# Patient Record
Sex: Female | Born: 1985
Health system: Southern US, Community
[De-identification: ages and names within clinical notes are randomized; demographics above are authoritative.]

## PROBLEM LIST (undated history)

## (undated) DIAGNOSIS — E119 Type 2 diabetes mellitus without complications: Secondary | ICD-10-CM

## (undated) HISTORY — PX: BREAST SURGERY: SHX581

---

## 2014-08-01 ENCOUNTER — Emergency Department (HOSPITAL_COMMUNITY)
Admission: EM | Admit: 2014-08-01 | Discharge: 2014-08-01 | Disposition: A | Discharge status: Home / Self Care | Payer: Medicaid Other | Attending: Emergency Medicine | Admitting: Emergency Medicine

## 2014-08-01 ENCOUNTER — Encounter (HOSPITAL_COMMUNITY): Payer: Self-pay | Admitting: *Deleted

## 2014-08-01 DIAGNOSIS — K088 Other specified disorders of teeth and supporting structures: Secondary | ICD-10-CM | POA: Diagnosis not present

## 2014-08-01 DIAGNOSIS — T402X5A Adverse effect of other opioids, initial encounter: Secondary | ICD-10-CM | POA: Insufficient documentation

## 2014-08-01 DIAGNOSIS — T7840XA Allergy, unspecified, initial encounter: Secondary | ICD-10-CM | POA: Diagnosis not present

## 2014-08-01 DIAGNOSIS — Z91040 Latex allergy status: Secondary | ICD-10-CM | POA: Diagnosis not present

## 2014-08-01 DIAGNOSIS — E119 Type 2 diabetes mellitus without complications: Secondary | ICD-10-CM | POA: Diagnosis not present

## 2014-08-01 HISTORY — DX: Type 2 diabetes mellitus without complications: E11.9

## 2014-08-01 LAB — CBG MONITORING, ED: GLUCOSE-CAPILLARY: 83 mg/dL (ref 65–99)

## 2014-08-01 MED ORDER — EPINEPHRINE 0.3 MG/0.3ML IJ SOAJ: 0.3000 mg | Freq: Once | INTRAMUSCULAR | Status: DC

## 2014-08-01 NOTE — ED Notes (Signed)
Pt states that she began having throat tightness and tingling to her tongue this morning. Pt states that she used her epipen. Pt was recently started on antibiotics for a tooth abscess. Pt states that she is unsure if it is due to that or something that she ate.

## 2014-08-01 NOTE — Discharge Instructions (Signed)
Drug Allergy Stop Norco. Take ibuprofen for pain as directed. If your symptoms recur, use the EpiPen and return immediately. Contact your oral surgeon today to schedule the next available appointment for your dental abscess Allergic reactions to medicines are common. Some allergic reactions are mild. A delayed type of drug allergy that occurs 1 week or more after exposure to a medicine or vaccine is called serum sickness. A life-threatening, sudden (acute) allergic reaction that involves the whole body is called anaphylaxis. CAUSES  "True" drug allergies occur when there is an allergic reaction to a medicine. This is caused by overactivity of the immune system. First, the body becomes sensitized. The immune system is triggered by your first exposure to the medicine. Following this first exposure, future exposure to the same medicine may be life-threatening. Almost any medicine can cause an allergic reaction. Common ones are:  Penicillin.  Sulfonamides (sulfa drugs).  Local anesthetics.  X-ray dyes that contain iodine. SYMPTOMS  Common symptoms of a minor allergic reaction are:  Swelling around the mouth.  An itchy red rash or hives.  Vomiting or diarrhea. Anaphylaxis can cause swelling of the mouth and throat. This makes it difficult to breathe and swallow. Severe reactions can be fatal within seconds, even after exposure to only a trace amount of the drug that causes the reaction. HOME CARE INSTRUCTIONS   If you are unsure of what caused your reaction, keep a diary of foods and medicines used. Include the symptoms that followed. Avoid anything that causes reactions.  You may want to follow up with an allergy specialist after the reaction has cleared in order to be tested to confirm the allergy. It is important to confirm that your reaction is an allergy, not just a side effect to the medicine. If you have a true allergy to a medicine, this may prevent that medicine and related medicines  from being given to you when you are very ill.  If you have hives or a rash:  Take medicines as directed by your caregiver.  You may use an over-the-counter antihistamine (diphenhydramine) as needed.  Apply cold compresses to the skin or take baths in cool water. Avoid hot baths or showers.  If you are severely allergic:  Continuous observation after a severe reaction may be needed. Hospitalization is often required.  Wear a medical alert bracelet or necklace stating your allergy.  You and your family must learn how to use an anaphylaxis kit or give an epinephrine injection to temporarily treat an emergency allergic reaction. If you have had a severe reaction, always carry your epinephrine injection or anaphylaxis kit with you. This can be lifesaving if you have a severe reaction.  Do not drive or perform tasks after treatment until the medicines used to treat your reaction have worn off, or until your caregiver says it is okay. SEEK MEDICAL CARE IF:   You think you had an allergic reaction. Symptoms usually start within 30 minutes after exposure.  Symptoms are getting worse rather than better.  You develop new symptoms.  The symptoms that brought you to your caregiver return. SEEK IMMEDIATE MEDICAL CARE IF:   You have swelling of the mouth, difficulty breathing, or wheezing.  You have a tight feeling in your chest or throat.  You develop hives, swelling, or itching all over your body.  You develop severe vomiting or diarrhea.  You feel faint or pass out. This is an emergency. Use your epinephrine injection or anaphylaxis kit as you have been instructed.  Call for emergency medical help. Even if you improve after the injection, you need to be examined at a hospital emergency department. MAKE SURE YOU:   Understand these instructions.  Will watch your condition.  Will get help right away if you are not doing well or get worse. Document Released: 01/28/2005 Document  Revised: 04/22/2011 Document Reviewed: 07/04/2010 [facility] Patient Information 2015 [place], [place]. This information is not intended to replace advice given to you by your health care provider. Make sure you discuss any questions you have with your health care provider.

## 2014-08-01 NOTE — ED Provider Notes (Signed)
CSN: [id number]     Arrival date & time 08/01/14  1224 History   First MD Initiated Contact with Patient 08/01/14 1256     Chief Complaint  Patient presents with  . Allergic Reaction     (Consider location/radiation/quality/duration/timing/severity/associated sxs/prior Treatment) HPI Patient developed gradual onset congestion in her throat and tightness in her throat which started 2 nights ago. She presents today as she developed tingling on the right side of her tongue and stated the right side of her tongue was swollen. She treated herself with an EpiPen and Benadryl 6:30 AM today and symptoms have improved. She was started on amoxicillin and Norco 3 days ago for a dental abscess. Tooth #29. No other associated symptoms. Past Medical History  Diagnosis Date  . Diabetes mellitus without complication    History reviewed. No pertinent past surgical history. No family history on file. History  Substance Use Topics  . Smoking status: Never Smoker   . Smokeless tobacco: Not on file  . Alcohol Use: Yes   OB History    No data available     Review of Systems  Constitutional: Negative.   HENT: Positive for congestion and dental problem.   Respiratory: Negative.   Cardiovascular: Negative.   Gastrointestinal: Negative.   Musculoskeletal: Negative.   Skin: Negative.   Neurological: Negative.   Psychiatric/Behavioral: Negative.   All other systems reviewed and are negative.     Allergies  Latex; Lortab; Reglan; Shellfish allergy; and Tylenol  Home Medications   Prior to Admission medications   Not on File   BP 126/72 mmHg  Pulse 98  Temp(Src) 98.4 F (36.9 C) (Oral)  Resp 18  SpO2 100% Physical Exam  Constitutional: She appears well-developed and well-nourished. No distress.  HENT:  Head: Normocephalic and atraumatic.  Tongue is normal. No fluctuance or swelling of gingiva. Tooth #29 is tender. Not loose  Eyes: Conjunctivae are normal. Pupils are equal, round, and  reactive to light.  Neck: Neck supple. No tracheal deviation present. No thyromegaly present.  Cardiovascular: Normal rate and regular rhythm.   No murmur heard. Pulmonary/Chest: Effort normal and breath sounds normal.  Abdominal: Soft. Bowel sounds are normal. She exhibits no distension. There is no tenderness.  Musculoskeletal: Normal range of motion. She exhibits no edema or tenderness.  Neurological: She is alert. Coordination normal.  Skin: Skin is warm and dry. No rash noted.  Psychiatric: She has a normal mood and affect.  Nursing note and vitals reviewed.   ED Course  Procedures (including critical care time) Labs Review Labs Reviewed - No data to display  Imaging Review No results found.   EKG Interpretation None     2:05 PM patient asymptomatic, no distress MDM  In light of patient's prior allergic reaction to Lortab, I advised her to stop Norco. She should take ibuprofen for pain. Prescription EpiPen Final diagnoses:  None   Diagnosis allergic reaction to medication     Doug Sou, MD 08/01/14 1410

## 2014-08-01 NOTE — ED Notes (Signed)
Pt CBG 82 

## 2014-09-08 ENCOUNTER — Emergency Department (HOSPITAL_COMMUNITY)
Admission: EM | Admit: 2014-09-08 | Discharge: 2014-09-08 | Disposition: A | Discharge status: Home / Self Care | Payer: Medicaid Other | Attending: Emergency Medicine | Admitting: Emergency Medicine

## 2014-09-08 ENCOUNTER — Encounter (HOSPITAL_COMMUNITY): Payer: Self-pay | Admitting: Emergency Medicine

## 2014-09-08 DIAGNOSIS — Z7951 Long term (current) use of inhaled steroids: Secondary | ICD-10-CM | POA: Insufficient documentation

## 2014-09-08 DIAGNOSIS — K088 Other specified disorders of teeth and supporting structures: Secondary | ICD-10-CM | POA: Diagnosis not present

## 2014-09-08 DIAGNOSIS — Z91040 Latex allergy status: Secondary | ICD-10-CM | POA: Diagnosis not present

## 2014-09-08 DIAGNOSIS — E119 Type 2 diabetes mellitus without complications: Secondary | ICD-10-CM | POA: Diagnosis not present

## 2014-09-08 DIAGNOSIS — Z792 Long term (current) use of antibiotics: Secondary | ICD-10-CM | POA: Insufficient documentation

## 2014-09-08 DIAGNOSIS — Z79899 Other long term (current) drug therapy: Secondary | ICD-10-CM | POA: Insufficient documentation

## 2014-09-08 DIAGNOSIS — K0889 Other specified disorders of teeth and supporting structures: Secondary | ICD-10-CM

## 2014-09-08 MED ORDER — IBUPROFEN 800 MG PO TABS: 800.0000 mg | ORAL_TABLET | Freq: Three times a day (TID) | ORAL | Status: DC

## 2014-09-08 MED ORDER — AMOXICILLIN 250 MG/5ML PO SUSR: 500.0000 mg | Freq: Three times a day (TID) | ORAL | Status: DC

## 2014-09-08 NOTE — ED Notes (Signed)
Patient states has an appointment next Friday with dentist.   Patient states she had amoxicillin for an abscess a few weeks ago.  Patient states she didn't finish the antibiotics and still has some gum irritation.   Patient states she can't go to dentist until last Friday.  Patient states no pain, just "irritating".

## 2014-09-08 NOTE — ED Provider Notes (Signed)
CSN: [id number]     Arrival date & time 09/08/14  0945 History  This chart was scribed for non-physician practitioner, Santiago Glad, PA-C working with Nelva Nay, MD by Gwenyth Ober, ED scribe. This patient was seen in room TR07C/TR07C and the patient's care was started at 10:24 AM   Chief Complaint  Patient presents with  . gum irritation    The history is provided by the patient. No language interpreter was used.   HPI Comments: Jessica Benitez is a 29 y.o. female with a history of DM, who presents to the Emergency Department complaining of constant, mild right lower dental pain that started a few weeks ago. She states pain becomes worse with swallowing. Pt has tried drinking tea and gargling with peroxide with no relief. Pt was diagnosed with a dental abscess in the same area a few weeks prior, which was  treated with Amoxicillin and Norco. Pt reports swelling of her tongue after taking treatment. She was evaluated in the ED on 6/28 for the same and was diagnosed with a suspected allergy to Norco. Pt states that she stopped both the Norco and the Amoxicillin after the visit. She has had Amoxicillin without adverse reactions previously and only took 3 days of the antibiotic treatment. Pt has a follow-up appointment with her dentist in 1 week. She denies difficulty swallowing, fever, chills, nausea and vomiting as associated symptoms.   Past Medical History  Diagnosis Date  . Diabetes mellitus without complication    No past surgical history on file. No family history on file. History  Substance Use Topics  . Smoking status: Never Smoker   . Smokeless tobacco: Not on file  . Alcohol Use: Yes   OB History    No data available     Review of Systems  Constitutional: Negative for fever and chills.  HENT: Positive for dental problem. Negative for trouble swallowing.   Gastrointestinal: Negative for nausea and vomiting.      Allergies  Latex; Lortab; Reglan; Shellfish  allergy; and Tylenol  Home Medications   Prior to Admission medications   Medication Sig Start Date End Date Taking? Authorizing Provider  albuterol (PROVENTIL HFA;VENTOLIN HFA) 108 (90 BASE) MCG/ACT inhaler Inhale 1 puff into the lungs every 6 (six) hours as needed for wheezing or shortness of breath.    Historical Provider, MD  ALPRAZolam Prudy Feeler) 0.5 MG tablet Take 0.5 mg by mouth 3 (three) times daily as needed for anxiety.    Historical Provider, MD  amoxicillin (AMOXIL) 250 MG/5ML suspension Take 10 mLs by mouth 3 (three) times daily. 07/30/14   Historical Provider, MD  beclomethasone (QVAR) 80 MCG/ACT inhaler Inhale 1 puff into the lungs 2 (two) times daily.    Historical Provider, MD  cyclobenzaprine (FLEXERIL) 10 MG tablet Take 10 mg by mouth daily as needed for muscle spasms.    Historical Provider, MD  diphenhydrAMINE (SOMINEX) 25 MG tablet Take 25 mg by mouth at bedtime as needed for sleep.    Historical Provider, MD  EPINEPHrine (EPIPEN 2-PAK) 0.3 mg/0.3 mL IJ SOAJ injection Inject 0.3 mLs (0.3 mg total) into the muscle once. 08/01/14   Doug Sou, MD  HYDROcodone-acetaminophen (NORCO) 7.5-325 MG per tablet Take 1 tablet by mouth every 6 (six) hours as needed. 07/30/14   Historical Provider, MD  ibuprofen (ADVIL,MOTRIN) 800 MG tablet Take 800 mg by mouth every 8 (eight) hours as needed for mild pain.    Historical Provider, MD  Multiple Vitamins-Minerals (MULTIVITAMIN WITH MINERALS) tablet Take  1 tablet by mouth daily.    Historical Provider, MD  promethazine (PHENERGAN) 6.25 MG/5ML syrup Take 6.25 mg by mouth every 6 (six) hours as needed for nausea or vomiting.    Historical Provider, MD   BP 137/78 mmHg  Pulse 78  Temp(Src) 98 F (36.7 C) (Oral)  Resp 16  SpO2 100%  LMP 09/05/2014 Physical Exam  Constitutional: She appears well-developed and well-nourished. No distress.  HENT:  Head: Normocephalic and atraumatic.  TTP of right lower gingiva; no obvious abscess present; no  sublingual tenderness or swelling; no submental or submandibular adenopathy; no trismus  Eyes: Conjunctivae and EOM are normal.  Neck: Normal range of motion. Neck supple. No tracheal deviation present.  Cardiovascular: Normal rate, regular rhythm and normal heart sounds.   Pulmonary/Chest: Effort normal and breath sounds normal. No respiratory distress.  Neurological: She is alert.  Skin: Skin is warm and dry.  Psychiatric: She has a normal mood and affect. Her behavior is normal.  Nursing note and vitals reviewed.   ED Course  Procedures   DIAGNOSTIC STUDIES: Oxygen Saturation is 100% on RA, normal by my interpretation.    COORDINATION OF CARE: 10:26 AM Discussed treatment plan with pt which includes a prescription for Amoxicillin and pain management. Pt agreed to plan.   Labs Review Labs Reviewed - No data to display  Imaging Review No results found.   EKG Interpretation None      MDM   Final diagnoses:  None  Patient with toothache.  No gross abscess.  Exam unconcerning for Ludwig's angina or spread of infection.  Will treat with Amoxicillin and Ibuprofen.  Urged patient to follow-up with dentist.    I personally performed the services described in this documentation, which was scribed in my presence. The recorded information has been reviewed and is accurate.    Santiago Glad, PA-C 09/09/14 1918  Nelva Nay, MD 09/10/14 [id number]

## 2015-02-09 ENCOUNTER — Encounter (HOSPITAL_COMMUNITY): Payer: Self-pay | Admitting: Cardiology

## 2015-02-09 ENCOUNTER — Emergency Department (HOSPITAL_COMMUNITY)
Admission: EM | Admit: 2015-02-09 | Discharge: 2015-02-09 | Disposition: A | Discharge status: Home / Self Care | Payer: Medicaid Other | Attending: Emergency Medicine | Admitting: Emergency Medicine

## 2015-02-09 DIAGNOSIS — Z91040 Latex allergy status: Secondary | ICD-10-CM | POA: Insufficient documentation

## 2015-02-09 DIAGNOSIS — H9201 Otalgia, right ear: Secondary | ICD-10-CM | POA: Diagnosis present

## 2015-02-09 DIAGNOSIS — Z792 Long term (current) use of antibiotics: Secondary | ICD-10-CM | POA: Insufficient documentation

## 2015-02-09 DIAGNOSIS — Z79899 Other long term (current) drug therapy: Secondary | ICD-10-CM | POA: Diagnosis not present

## 2015-02-09 DIAGNOSIS — E119 Type 2 diabetes mellitus without complications: Secondary | ICD-10-CM | POA: Insufficient documentation

## 2015-02-09 DIAGNOSIS — Z791 Long term (current) use of non-steroidal anti-inflammatories (NSAID): Secondary | ICD-10-CM | POA: Insufficient documentation

## 2015-02-09 DIAGNOSIS — H6001 Abscess of right external ear: Secondary | ICD-10-CM | POA: Diagnosis not present

## 2015-02-09 DIAGNOSIS — Z7951 Long term (current) use of inhaled steroids: Secondary | ICD-10-CM | POA: Insufficient documentation

## 2015-02-09 LAB — CBG MONITORING, ED: Glucose-Capillary: 76 mg/dL (ref 65–99)

## 2015-02-09 MED ORDER — SULFAMETHOXAZOLE-TRIMETHOPRIM 800-160 MG PO TABS: 1.0000 | ORAL_TABLET | Freq: Two times a day (BID) | ORAL | Status: AC

## 2015-02-09 MED ORDER — LIDOCAINE HCL 1 % IJ SOLN
10.0000 mL | Freq: Once | INTRAMUSCULAR | Status: AC
  Administered 2015-02-09: 10 mL
  Filled 2015-02-09 (×2): qty 10

## 2015-02-09 MED ORDER — CEPHALEXIN 500 MG PO CAPS: 500.0000 mg | ORAL_CAPSULE | Freq: Four times a day (QID) | ORAL | Status: DC

## 2015-02-09 MED ORDER — NAPROXEN 500 MG PO TABS: 500.0000 mg | ORAL_TABLET | Freq: Two times a day (BID) | ORAL | Status: DC

## 2015-02-09 NOTE — ED Provider Notes (Signed)
CSN: [id number]     Arrival date & time 02/09/15  6295 History   First MD Initiated Contact with Patient 02/09/15 0913     Chief Complaint  Patient presents with  . Otalgia  . Facial Swelling     (Consider location/radiation/quality/duration/timing/severity/associated sxs/prior Treatment) HPI Comments: Patient reports 6 day history of pain and swelling behind her right earlobe. Denies any falls or trauma. No ear piercing. No fever. No bleeding or drainage. No cough, shortness of breath, nausea, vomiting or chest pain. There is no history of diabetes despite the recorded in her chart. She's been using Percocet for pain without relief. She denies any difficulty swallowing or breathing. Pain radiates down her side of her neck and the back of her head. Denies any headache.  Patient is a 29 y.o. female presenting with ear pain. The history is provided by the patient.  Otalgia Associated symptoms: no abdominal pain, no congestion, no cough, no fever, no headaches, no rash and no vomiting     Past Medical History  Diagnosis Date  . Diabetes mellitus without complication [facility])    Past Surgical History  Procedure Laterality Date  . Breast surgery     History reviewed. No pertinent family history. Social History  Substance Use Topics  . Smoking status: Current Some Day Smoker  . Smokeless tobacco: None  . Alcohol Use: Yes   OB History    No data available     Review of Systems  Constitutional: Negative for fever, activity change and appetite change.  HENT: Positive for ear pain and facial swelling. Negative for congestion.   Eyes: Negative for visual disturbance.  Respiratory: Negative for cough, chest tightness and shortness of breath.   Cardiovascular: Negative for chest pain.  Gastrointestinal: Negative for nausea, vomiting and abdominal pain.  Genitourinary: Negative for hematuria, vaginal bleeding and vaginal discharge.  Musculoskeletal: Negative for back pain.  Skin:  Negative for rash.  Neurological: Negative for dizziness, weakness and headaches.  A complete 10 system review of systems was obtained and all systems are negative except as noted in the HPI and PMH.      Allergies  Latex; Lortab; Reglan; Shellfish allergy; and Tylenol  Home Medications   Prior to Admission medications   Medication Sig Start Date End Date Taking? Authorizing Provider  albuterol (PROVENTIL HFA;VENTOLIN HFA) 108 (90 BASE) MCG/ACT inhaler Inhale 1 puff into the lungs every 6 (six) hours as needed for wheezing or shortness of breath.    Historical Provider, MD  ALPRAZolam Prudy Feeler) 0.5 MG tablet Take 0.5 mg by mouth 3 (three) times daily as needed for anxiety.    Historical Provider, MD  amoxicillin (AMOXIL) 250 MG/5ML suspension Take 10 mLs (500 mg total) by mouth 3 (three) times daily. Use for 10 days 09/08/14   Santiago Glad, PA-C  beclomethasone (QVAR) 80 MCG/ACT inhaler Inhale 1 puff into the lungs 2 (two) times daily.    Historical Provider, MD  cephALEXin (KEFLEX) 500 MG capsule Take 1 capsule (500 mg total) by mouth 4 (four) times daily. 02/09/15   Glynn Octave, MD  cyclobenzaprine (FLEXERIL) 10 MG tablet Take 10 mg by mouth daily as needed for muscle spasms.    Historical Provider, MD  diphenhydrAMINE (SOMINEX) 25 MG tablet Take 25 mg by mouth at bedtime as needed for sleep.    Historical Provider, MD  EPINEPHrine (EPIPEN 2-PAK) 0.3 mg/0.3 mL IJ SOAJ injection Inject 0.3 mLs (0.3 mg total) into the muscle once. 08/01/14   Doug Sou, MD  HYDROcodone-acetaminophen (NORCO) 7.5-325 MG per tablet Take 1 tablet by mouth every 6 (six) hours as needed. 07/30/14   Historical Provider, MD  ibuprofen (ADVIL,MOTRIN) 800 MG tablet Take 1 tablet (800 mg total) by mouth 3 (three) times daily. 09/08/14   Santiago Glad, PA-C  Multiple Vitamins-Minerals (MULTIVITAMIN WITH MINERALS) tablet Take 1 tablet by mouth daily.    Historical Provider, MD  naproxen (NAPROSYN) 500 MG tablet  Take 1 tablet (500 mg total) by mouth 2 (two) times daily. 02/09/15   Glynn Octave, MD  promethazine (PHENERGAN) 6.25 MG/5ML syrup Take 6.25 mg by mouth every 6 (six) hours as needed for nausea or vomiting.    Historical Provider, MD  sulfamethoxazole-trimethoprim (BACTRIM DS,SEPTRA DS) 800-160 MG tablet Take 1 tablet by mouth 2 (two) times daily. 02/09/15 02/16/15  Glynn Octave, MD   BP 126/70 mmHg  Pulse 74  Temp(Src) 98.7 F (37.1 C) (Oral)  Resp 16  Ht  (1.626 m)  Wt 170 lb (77.111 kg)  BMI 29.17 kg/m2  SpO2 100%  LMP 01/25/2015 Physical Exam  Constitutional: She is oriented to person, place, and time. She appears well-developed and well-nourished. No distress.  HENT:  Head: Normocephalic and atraumatic.  Mouth/Throat: Oropharynx is clear and moist. No oropharyngeal exudate.  1 cm firm induration to posterior right earlobe. There is no fluctuance. There is no erythema. Tympanic membrane is normal. No mastoid or tragus tenderness. Full range of motion of neck.  Eyes: Conjunctivae and EOM are normal. Pupils are equal, round, and reactive to light.  Neck: Normal range of motion. Neck supple.  No meningismus.  Cardiovascular: Normal rate, regular rhythm, normal heart sounds and intact distal pulses.   No murmur heard. Pulmonary/Chest: Effort normal and breath sounds normal. No respiratory distress.  Abdominal: Soft. There is no tenderness. There is no rebound and no guarding.  Musculoskeletal: Normal range of motion. She exhibits no edema or tenderness.  Neurological: She is alert and oriented to person, place, and time. No cranial nerve deficit. She exhibits normal muscle tone. Coordination normal.  No ataxia on finger to nose bilaterally. No pronator drift. 5/5 strength throughout. CN 2-12 intact.Equal grip strength. Sensation intact.   Skin: Skin is warm.  Psychiatric: She has a normal mood and affect. Her behavior is normal.  Nursing note and vitals reviewed.   ED  Course  Procedures (including critical care time) Labs Review Labs Reviewed  CBG MONITORING, ED    Imaging Review No results found. I have personally reviewed and evaluated these images and lab results as part of my medical decision-making.   EKG Interpretation None      MDM   Final diagnoses:  Abscess, earlobe, right   likely cyst behind right ear lobe. No fluctuance. Discussed with patient that this is likely a cyst that required surgical removal. Does not appear to be an abscess. She is agreeable to aspiration for minimal cosmetic consequences  Aspiration did yield gross pus. Incision and drainage performed as below. Patient tolerated procedure well. No further purulence obtained.  Follow-up with ENT. Discussed antibiotics, warm compresses, return precautions.  INCISION AND DRAINAGE Performed by: Glynn Octave Consent: Verbal consent obtained. Risks and benefits: risks, benefits and alternatives were discussed Type: abscess  Body area: R posterior earlobe     Anesthesia: local infiltration  Incision was made with a scalpel.  Local anesthetic: lidocaine 1%   Anesthetic total: 2 ml  Complexity: complex Blunt dissection to break up loculations  Drainage: purulent  Drainage amount: moderate  Packing material: none  Patient tolerance: Patient tolerated the procedure well with no immediate complications.     Glynn OctaveStephen Rihana Kiddy, MD 02/09/15 1105

## 2015-02-09 NOTE — ED Notes (Signed)
Reports she noticed a cyst behind her right ear a couple of days ago. States the area has gotten bigger and hurts down into her neck.

## 2015-02-09 NOTE — Discharge Instructions (Signed)
Abscess Follow up with the ENT doctor in 2 days for a recheck. Use the antibiotics and warm compresses as prescribed. Return to the ED if you develop new or worsening symptoms. An abscess is an infected area that contains a collection of pus and debris.It can occur in almost any part of the body. An abscess is also known as a furuncle or boil. CAUSES  An abscess occurs when tissue gets infected. This can occur from blockage of oil or sweat glands, infection of hair follicles, or a minor injury to the skin. As the body tries to fight the infection, pus collects in the area and creates pressure under the skin. This pressure causes pain. People with weakened immune systems have difficulty fighting infections and get certain abscesses more often.  SYMPTOMS Usually an abscess develops on the skin and becomes a painful mass that is red, warm, and tender. If the abscess forms under the skin, you may feel a moveable soft area under the skin. Some abscesses break open (rupture) on their own, but most will continue to get worse without care. The infection can spread deeper into the body and eventually into the bloodstream, causing you to feel ill.  DIAGNOSIS  Your caregiver will take your medical history and perform a physical exam. A sample of fluid may also be taken from the abscess to determine what is causing your infection. TREATMENT  Your caregiver may prescribe antibiotic medicines to fight the infection. However, taking antibiotics alone usually does not cure an abscess. Your caregiver may need to make a small cut (incision) in the abscess to drain the pus. In some cases, gauze is packed into the abscess to reduce pain and to continue draining the area. HOME CARE INSTRUCTIONS   Only take over-the-counter or prescription medicines for pain, discomfort, or fever as directed by your caregiver.  If you were prescribed antibiotics, take them as directed. Finish them even if you start to feel better.  If  gauze is used, follow your caregiver's directions for changing the gauze.  To avoid spreading the infection:  Keep your draining abscess covered with a bandage.  Wash your hands well.  Do not share personal care items, towels, or whirlpools with others.  Avoid skin contact with others.  Keep your skin and clothes clean around the abscess.  Keep all follow-up appointments as directed by your caregiver. SEEK MEDICAL CARE IF:   You have increased pain, swelling, redness, fluid drainage, or bleeding.  You have muscle aches, chills, or a general ill feeling.  You have a fever. MAKE SURE YOU:   Understand these instructions.  Will watch your condition.  Will get help right away if you are not doing well or get worse.   This information is not intended to replace advice given to you by your health care provider. Make sure you discuss any questions you have with your health care provider.   Document Released: 11/07/2004 Document Revised: 07/30/2011 Document Reviewed: 04/12/2011 Elsevier Interactive Patient Education [employer]

## 2015-02-09 NOTE — ED Notes (Signed)
Doctor at bedside.

## 2015-05-17 ENCOUNTER — Encounter (INDEPENDENT_AMBULATORY_CARE_PROVIDER_SITE_OTHER): Payer: Self-pay

## 2015-10-31 LAB — LAB REPORT - SCANNED: Pap: NEGATIVE

## 2016-05-15 ENCOUNTER — Emergency Department (HOSPITAL_COMMUNITY): Payer: Medicaid Other

## 2016-05-15 ENCOUNTER — Encounter (HOSPITAL_COMMUNITY): Payer: Self-pay | Admitting: *Deleted

## 2016-05-15 ENCOUNTER — Emergency Department (HOSPITAL_COMMUNITY)
Admission: EM | Admit: 2016-05-15 | Discharge: 2016-05-15 | Disposition: A | Discharge status: Home / Self Care | Payer: Medicaid Other | Attending: Emergency Medicine | Admitting: Emergency Medicine

## 2016-05-15 DIAGNOSIS — O2341 Unspecified infection of urinary tract in pregnancy, first trimester: Secondary | ICD-10-CM | POA: Diagnosis not present

## 2016-05-15 DIAGNOSIS — O208 Other hemorrhage in early pregnancy: Secondary | ICD-10-CM | POA: Insufficient documentation

## 2016-05-15 DIAGNOSIS — Z91040 Latex allergy status: Secondary | ICD-10-CM | POA: Diagnosis not present

## 2016-05-15 DIAGNOSIS — O99331 Smoking (tobacco) complicating pregnancy, first trimester: Secondary | ICD-10-CM | POA: Insufficient documentation

## 2016-05-15 DIAGNOSIS — R112 Nausea with vomiting, unspecified: Secondary | ICD-10-CM

## 2016-05-15 DIAGNOSIS — O26899 Other specified pregnancy related conditions, unspecified trimester: Secondary | ICD-10-CM

## 2016-05-15 DIAGNOSIS — R109 Unspecified abdominal pain: Secondary | ICD-10-CM

## 2016-05-15 DIAGNOSIS — O219 Vomiting of pregnancy, unspecified: Secondary | ICD-10-CM | POA: Diagnosis not present

## 2016-05-15 DIAGNOSIS — O26891 Other specified pregnancy related conditions, first trimester: Secondary | ICD-10-CM | POA: Diagnosis present

## 2016-05-15 DIAGNOSIS — O418X10 Other specified disorders of amniotic fluid and membranes, first trimester, not applicable or unspecified: Secondary | ICD-10-CM

## 2016-05-15 DIAGNOSIS — E119 Type 2 diabetes mellitus without complications: Secondary | ICD-10-CM | POA: Insufficient documentation

## 2016-05-15 DIAGNOSIS — O468X1 Other antepartum hemorrhage, first trimester: Secondary | ICD-10-CM

## 2016-05-15 DIAGNOSIS — Z3A01 Less than 8 weeks gestation of pregnancy: Secondary | ICD-10-CM | POA: Diagnosis not present

## 2016-05-15 DIAGNOSIS — F17200 Nicotine dependence, unspecified, uncomplicated: Secondary | ICD-10-CM | POA: Insufficient documentation

## 2016-05-15 LAB — URINALYSIS, ROUTINE W REFLEX MICROSCOPIC
Bilirubin Urine: NEGATIVE
GLUCOSE, UA: NEGATIVE mg/dL
KETONES UR: 80 mg/dL — AB
NITRITE: NEGATIVE
Protein, ur: 30 mg/dL — AB
SPECIFIC GRAVITY, URINE: 1.03 (ref 1.005–1.030)
pH: 5 (ref 5.0–8.0)

## 2016-05-15 LAB — MAGNESIUM: MAGNESIUM: 2 mg/dL (ref 1.7–2.4)

## 2016-05-15 LAB — I-STAT BETA HCG BLOOD, ED (MC, WL, AP ONLY): I-stat hCG, quantitative: >2000 m[IU]/mL — ABNORMAL HIGH (ref <5)

## 2016-05-15 LAB — COMPREHENSIVE METABOLIC PANEL
ALBUMIN: 4 g/dL (ref 3.5–5.0)
ALT: 15 U/L (ref 14–54)
ANION GAP: 13 (ref 5–15)
AST: 28 U/L (ref 15–41)
Alkaline Phosphatase: 34 U/L — ABNORMAL LOW (ref 38–126)
BILIRUBIN TOTAL: 0.6 mg/dL (ref 0.3–1.2)
BUN: 8 mg/dL (ref 6–20)
CALCIUM: 9.7 mg/dL (ref 8.9–10.3)
CO2: 22 mmol/L (ref 22–32)
Chloride: 100 mmol/L — ABNORMAL LOW (ref 101–111)
Creatinine, Ser: 0.69 mg/dL (ref 0.44–1.00)
GFR calc Af Amer: >60 mL/min (ref >60)
GFR calc non Af Amer: >60 mL/min (ref >60)
GLUCOSE: 115 mg/dL — AB (ref 65–99)
Potassium: 2.8 mmol/L — ABNORMAL LOW (ref 3.5–5.1)
Sodium: 135 mmol/L (ref 135–145)
TOTAL PROTEIN: 7.6 g/dL (ref 6.5–8.1)

## 2016-05-15 LAB — CBC
HCT: 37.2 % (ref 36.0–46.0)
Hemoglobin: 13 g/dL (ref 12.0–15.0)
MCH: 30.9 pg (ref 26.0–34.0)
MCHC: 34.9 g/dL (ref 30.0–36.0)
MCV: 88.4 fL (ref 78.0–100.0)
Platelets: 315 10*3/uL (ref 150–400)
RBC: 4.21 MIL/uL (ref 3.87–5.11)
RDW: 13.3 % (ref 11.5–15.5)
WBC: 10.9 10*3/uL — ABNORMAL HIGH (ref 4.0–10.5)

## 2016-05-15 LAB — LIPASE, BLOOD: Lipase: <10 U/L — ABNORMAL LOW (ref 11–51)

## 2016-05-15 MED ORDER — SODIUM CHLORIDE 0.9 % IV BOLUS (SEPSIS)
1000.0000 mL | Freq: Once | INTRAVENOUS | Status: AC
  Administered 2016-05-15: 1000 mL via INTRAVENOUS

## 2016-05-15 MED ORDER — ONDANSETRON HCL 4 MG/2ML IJ SOLN
4.0000 mg | Freq: Once | INTRAMUSCULAR | Status: AC
Start: 2016-05-15 — End: 2016-05-15
  Administered 2016-05-15: 4 mg via INTRAVENOUS
  Filled 2016-05-15: qty 2

## 2016-05-15 MED ORDER — CEFPODOXIME PROXETIL 100 MG PO TABS: 100.0000 mg | ORAL_TABLET | Freq: Two times a day (BID) | ORAL | 0 refills | Status: AC

## 2016-05-15 MED ORDER — POTASSIUM CHLORIDE ER 10 MEQ PO TBCR: 10.0000 meq | EXTENDED_RELEASE_TABLET | Freq: Two times a day (BID) | ORAL | 0 refills | Status: DC

## 2016-05-15 MED ORDER — SODIUM CHLORIDE 0.9 % IV SOLN
30.0000 meq | Freq: Once | INTRAVENOUS | Status: AC
  Administered 2016-05-15: 30 meq via INTRAVENOUS
  Filled 2016-05-15: qty 15

## 2016-05-15 MED ORDER — POTASSIUM CHLORIDE CRYS ER 20 MEQ PO TBCR
40.0000 meq | EXTENDED_RELEASE_TABLET | Freq: Once | ORAL | Status: AC
  Administered 2016-05-15: 40 meq via ORAL
  Filled 2016-05-15: qty 2

## 2016-05-15 MED ORDER — PYRIDOXINE HCL 100 MG/ML IJ SOLN
100.0000 mg | Freq: Once | INTRAMUSCULAR | Status: AC
Start: 2016-05-15 — End: 2016-05-15
  Administered 2016-05-15: 100 mg via INTRAVENOUS
  Filled 2016-05-15: qty 1

## 2016-05-15 MED ORDER — ONDANSETRON 4 MG PO TBDP: 4.0000 mg | ORAL_TABLET | Freq: Three times a day (TID) | ORAL | 0 refills | Status: DC | PRN

## 2016-05-15 NOTE — ED Notes (Signed)
Patient transported to Ultrasound 

## 2016-05-15 NOTE — ED Provider Notes (Signed)
[facility]-EMERGENCY DEPT Provider Note   CSN: [id number] Arrival date & time: 05/15/16  4098     History   Chief Complaint Chief Complaint  Patient presents with  . Emesis  . Abdominal Pain    HPI Jessica Benitez is a G45P3 31 y.o. female who presents to the Emergency Department with vomiting for 6 days. She reports associated nausea, sore throat, fatigue, bilateral lower abdominal cramping that shoots down to her vagina and radiates around to her right lower back. She denies dysuria, hematuria, vaginal bleeding, vaginal discharge, diarrhea, dyspnea, diarrhea, or rash. No sick contacts. She reports that she has not been able to keep down any PO fluids or food since vomiting began.  She reports she is currently 6-7 weeks pregnancy. LMP 2/17. No prenatal care to date. She reports that she was scheduled for an abortion, but when she went to the clinic for her appointment that the physician was unavailable. She plans to return for the procedure when she is feeling better. 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   No chronic medical problems. Allergies include aspirin, latex, Lortab, Reglan, and shellfish.   HPI  Past Medical History:  Diagnosis Date  . Diabetes mellitus without complication (HCC)     There are no active problems to display for this patient.   Past Surgical History:  Procedure Laterality Date  . BREAST SURGERY      OB History    Gravida Para Term Preterm AB Living   1             SAB TAB Ectopic Multiple Live Births                   Home Medications    Prior to Admission medications   Medication Sig Start Date End Date Taking? Authorizing Provider  albuterol (PROVENTIL HFA;VENTOLIN HFA) 108 (90 BASE) MCG/ACT inhaler Inhale 1 puff into the lungs every 6 (six) hours as needed for wheezing or shortness of breath.    Historical Provider, MD  ALPRAZolam Prudy Feeler) 0.5 MG tablet Take 0.5 mg by mouth 3 (three) times daily  as needed for anxiety.    Historical Provider, MD  amoxicillin (AMOXIL) 250 MG/5ML suspension Take 10 mLs (500 mg total) by mouth 3 (three) times daily. Use for 10 days 09/08/14   Santiago Glad, PA-C  beclomethasone (QVAR) 80 MCG/ACT inhaler Inhale 1 puff into the lungs 2 (two) times daily.    Historical Provider, MD  cefpodoxime (VANTIN) 100 MG tablet Take 1 tablet (100 mg total) by mouth 2 (two) times daily. 05/15/16 05/22/16  Zhanna Melin A Tionne Carelli, PA-C  cephALEXin (KEFLEX) 500 MG capsule Take 1 capsule (500 mg total) by mouth 4 (four) times daily. 02/09/15   Glynn Octave, MD  cyclobenzaprine (FLEXERIL) 10 MG tablet Take 10 mg by mouth daily as needed for muscle spasms.    Historical Provider, MD  diphenhydrAMINE (SOMINEX) 25 MG tablet Take 25 mg by mouth at bedtime as needed for sleep.    Historical Provider, MD  EPINEPHrine (EPIPEN 2-PAK) 0.3 mg/0.3 mL IJ SOAJ injection Inject 0.3 mLs (0.3 mg total) into the muscle once. 08/01/14   Doug Sou, MD  HYDROcodone-acetaminophen (NORCO) 7.5-325 MG per tablet Take 1 tablet by mouth every 6 (six) hours as needed. 07/30/14   Historical Provider, MD  ibuprofen (ADVIL,MOTRIN) 800 MG tablet Take 1 tablet (800 mg total) by mouth 3 (three) times daily. 09/08/14   Santiago Glad, PA-C  Multiple Vitamins-Minerals (MULTIVITAMIN WITH MINERALS) tablet  Take 1 tablet by mouth daily.    Historical Provider, MD  naproxen (NAPROSYN) 500 MG tablet Take 1 tablet (500 mg total) by mouth 2 (two) times daily. 02/09/15   Glynn Octave, MD  ondansetron (ZOFRAN ODT) 4 MG disintegrating tablet Take 1 tablet (4 mg total) by mouth every 8 (eight) hours as needed for nausea or vomiting. 05/15/16   Keimani Laufer A Courtnay Petrilla, PA-C  potassium chloride (K-DUR) 10 MEQ tablet Take 1 tablet (10 mEq total) by mouth 2 (two) times daily. 05/15/16 05/19/16  Aidynn Polendo A Shastina Rua, PA-C  promethazine (PHENERGAN) 6.25 MG/5ML syrup Take 6.25 mg by mouth every 6 (six) hours as needed for nausea or vomiting.    Historical  Provider, MD    Family History History reviewed. No pertinent family history.  Social History Social History  Substance Use Topics  . Smoking status: Current Some Day Smoker  . Smokeless tobacco: Not on file  . Alcohol use Yes     Allergies   Aspirin; Latex; Lortab [hydrocodone-acetaminophen]; Reglan [metoclopramide]; and Shellfish allergy   Review of Systems Review of Systems  Constitutional: Positive for activity change and fatigue. Negative for chills and fever.  HENT: Positive for sore throat.   Respiratory: Negative for shortness of breath.   Cardiovascular: Negative for chest pain.  Gastrointestinal: Positive for abdominal pain, nausea and vomiting. Negative for diarrhea.  Genitourinary: Positive for vaginal pain. Negative for vaginal bleeding and vaginal discharge.  Musculoskeletal: Positive for back pain.  Skin: Negative for rash.  Psychiatric/Behavioral: Negative for confusion.  All other systems reviewed and are negative.    Physical Exam Updated Vital Signs BP (!) 116/51   Pulse 78   Temp 98.9 F (37.2 C) (Oral)   Resp 11   LMP 03/30/2016   SpO2 100%   Physical Exam  Constitutional: She is oriented to person, place, and time. She appears well-developed and well-nourished.  HENT:  Head: Normocephalic and atraumatic.  Right Ear: Tympanic membrane normal.  Left Ear: Tympanic membrane normal.  Nose: Nose normal.  Mouth/Throat: Uvula is midline. Mucous membranes are dry. Posterior oropharyngeal erythema present. No oropharyngeal exudate, posterior oropharyngeal edema or tonsillar abscesses.  Eyes: Conjunctivae are normal.  Neck: Normal range of motion.  Cardiovascular: Normal rate, regular rhythm, normal heart sounds and intact distal pulses.  Exam reveals no gallop and no friction rub.   No murmur heard. Pulmonary/Chest: Effort normal and breath sounds normal. No respiratory distress. She has no wheezes. She has no rales.  Abdominal: Soft. Bowel sounds  are normal. She exhibits no distension. There is tenderness (Diffusely tender, but focal suprapubic tenderness). There is no guarding.  Musculoskeletal: Normal range of motion. She exhibits no tenderness.  Neurological: She is alert and oriented to person, place, and time.  Skin: Capillary refill takes less than 2 seconds. No rash noted. No erythema.  Psychiatric: She has a normal mood and affect. Her behavior is normal. Judgment and thought content normal.  Nursing note and vitals reviewed.    ED Treatments / Results  Labs (all labs ordered are listed, but only abnormal results are displayed) Labs Reviewed  COMPREHENSIVE METABOLIC PANEL - Abnormal; Notable for the following:       Result Value   Potassium 2.8 (*)    Chloride 100 (*)    Glucose, Bld 115 (*)    Alkaline Phosphatase 34 (*)    All other components within normal limits  CBC - Abnormal; Notable for the following:    WBC 10.9 (*)  All other components within normal limits  URINALYSIS, ROUTINE W REFLEX MICROSCOPIC - Abnormal; Notable for the following:    Color, Urine AMBER (*)    APPearance HAZY (*)    Hgb urine dipstick SMALL (*)    Ketones, ur 80 (*)    Protein, ur 30 (*)    Leukocytes, UA SMALL (*)    Bacteria, UA MANY (*)    Squamous Epithelial / LPF 0-5 (*)    All other components within normal limits  LIPASE, BLOOD - Abnormal; Notable for the following:    Lipase <10 (*)    All other components within normal limits  I-STAT BETA HCG BLOOD, ED ([facility], [facility], [facility] ONLY) - Abnormal; Notable for the following:    I-stat hCG, quantitative >2,000.0 (*)    All other components within normal limits  MAGNESIUM    EKG  EKG Interpretation  Date/Time:  Wednesday May 15 2016 10:58:15 EDT Ventricular Rate:  76 PR Interval:    QRS Duration: 78 QT Interval:  382 QTC Calculation: 430 R Axis:   65 Text Interpretation:  Sinus rhythm Probable left atrial enlargement normal. no old comparison Confirmed by Donnald Garre, MD,  Lebron Conners [phone]) on 05/15/2016 1:34:01 PM       Radiology US Ob Comp Less 14 Wks  Result Date: 05/15/2016 CLINICAL DATA:  Acute abdominal pain, first trimester of pregnancy. EXAM: OBSTETRIC <14 WK [place] AND TRANSVAGINAL OB US TECHNIQUE: Both transabdominal and transvaginal ultrasound examinations were performed for complete evaluation of the gestation as well as the maternal uterus, adnexal regions, and pelvic cul-de-sac. Transvaginal technique was performed to assess early pregnancy. COMPARISON:  None. FINDINGS: Intrauterine gestational sac: Single. Yolk sac:  Visualized. Embryo:  Visualized. Cardiac Activity: Visualized. Heart Rate: 149  bpm CRL: 8.8 mm 6 w 6 d [place] EDC: January 02, 2017. Subchorionic hemorrhage:  Small subchronic hemorrhage is noted. Maternal uterus/adnexae: Small amount of free fluid is noted. Probable right corpus luteum cyst is noted. Left ovary appears normal. IMPRESSION: Single live intrauterine gestation of 6 weeks 6 days. Small subchronic hemorrhage is noted. Electronically Signed   By: Lupita Raider, M.D.   On: 05/15/2016 12:23   US Ob Transvaginal  Result Date: 05/15/2016 CLINICAL DATA:  Acute abdominal pain, first trimester of pregnancy. EXAM: OBSTETRIC <14 WK [place] AND TRANSVAGINAL OB US TECHNIQUE: Both transabdominal and transvaginal ultrasound examinations were performed for complete evaluation of the gestation as well as the maternal uterus, adnexal regions, and pelvic cul-de-sac. Transvaginal technique was performed to assess early pregnancy. COMPARISON:  None. FINDINGS: Intrauterine gestational sac: Single. Yolk sac:  Visualized. Embryo:  Visualized. Cardiac Activity: Visualized. Heart Rate: 149  bpm CRL: 8.8 mm 6 w 6 d [place] EDC: January 02, 2017. Subchorionic hemorrhage:  Small subchronic hemorrhage is noted. Maternal uterus/adnexae: Small amount of free fluid is noted. Probable right corpus luteum cyst is noted. Left ovary appears normal. IMPRESSION: Single live intrauterine  gestation of 6 weeks 6 days. Small subchronic hemorrhage is noted. Electronically Signed   By: Lupita Raider, M.D.   On: 05/15/2016 12:23    Procedures Procedures (including critical care time)  Medications Ordered in ED Medications  ondansetron (ZOFRAN) injection 4 mg (4 mg Intravenous Given 05/15/16 1052)  sodium chloride 0.9 % bolus 1,000 mL (0 mLs Intravenous Stopped 05/15/16 1327)  potassium chloride 30 mEq in sodium chloride 0.9 % 265 mL (KCL MULTIRUN) IVPB (30 mEq Intravenous Given 05/15/16 1123)  potassium chloride SA (K-DUR,KLOR-CON) CR tablet 40 mEq (40 mEq  Oral Given 05/15/16 1534)  pyridOXINE (B-6) injection 100 mg (100 mg Intravenous Given 05/15/16 1428)     Initial Impression / Assessment and Plan / ED Course  I have reviewed the triage vital signs and the nursing notes.  Pertinent labs & imaging results that were available during my care of the patient were reviewed by me and considered in my medical decision making (see chart for details).     31 y.o. G25P3 female with a h/o of multiple miscarriages and premature births presents to the ED with emesis x6 days. Ketones, Hbg, and leukocyte esterase noted on UA. The patient does not have a surgical abdomen at this time. Transvaginal US demonstrates a single intrauterine sac measuring a gestational age of [redacted] weeks 6 days. Small subchorionic hemorrhage also present.  CMP with K of 2.8. EKG with NSR; no electrophysiologic changes. Upon recheck of the patient, she reports she was previously followed by [facility] cardiology clinic for a cardiac arrhythmia, but she is unsure of the condition. No records available through [facility]. Discussed the patient with Dr. Donnald Garre attending physician. Will give IVF and IV potassium chloride. IV Zofran given for nausea. Upon recheck, the patient is still nauseated. Will give pyridoxine for nausea. She reports improvement. Patient PO challenged and is able to drink a coke and eat crackers. Patient was also  given 40 mEq of potassium chloride PO, which she tolerated well. She reports her nausea has marked improved.   Discussed the US findings with the patient and there is no need for emergent treatment of the subchorionic hemorrhage. Will watch and wait. Discussed return precautions to the ED. Given UA and suprapubic tenderness on exam, will discharge with ABX for UTI and zofran for nausea, and potassium chloride. Advised the patient to follow up with primary care within the next week to have her potassium rechecked. The patient understands the plan and is agreeable at this time.    Final Clinical Impressions(s) / ED Diagnoses   Final diagnoses:  Subchorionic hemorrhage of placenta in first trimester, single or unspecified fetus  Urinary tract infection affecting care of mother in first trimester, antepartum  Non-intractable vomiting with nausea, unspecified vomiting type    New Prescriptions Discharge Medication List as of 05/15/2016  2:49 PM    START taking these medications   Details  cefpodoxime (VANTIN) 100 MG tablet Take 1 tablet (100 mg total) by mouth 2 (two) times daily., Starting Wed 05/15/2016, Until Wed 05/22/2016, Print    ondansetron (ZOFRAN ODT) 4 MG disintegrating tablet Take 1 tablet (4 mg total) by mouth every 8 (eight) hours as needed for nausea or vomiting., Starting Wed 05/15/2016, Print    potassium chloride (K-DUR) 10 MEQ tablet Take 1 tablet (10 mEq total) by mouth 2 (two) times daily., Starting Wed 05/15/2016, Until Sun 05/19/2016, Print         Ahmad Vanwey A Yazmen Briones, PA-C 05/16/16 1313    Arby Barrette, MD 05/22/16 1050

## 2016-05-15 NOTE — Discharge Instructions (Signed)
Your potassium was low today. Please fill the prescription for potassium and take 2 pills per day over the next 4 days. Please follow up with primary care and have your blood work repeated in the next 3-4 days to recheck your potassium.  Please return to the Emergency Department with any new or worsening symptoms including vaginal bleeding or fever.

## 2016-05-15 NOTE — ED Notes (Signed)
Pt is still tolerating po fluids, potassium still running, will be dc after it is completed, pt is aware of the plan and has family at bedside

## 2016-05-15 NOTE — ED Notes (Signed)
Pt aware of need for urine  

## 2016-05-15 NOTE — ED Triage Notes (Signed)
Pt reports being pregnant, having n/v and fatigue. Pt has sore throat, and lower abd cramping. Denies urinary symptoms, has vaginal discharge but denies bleeding. Reports having appt for an abortion but feels too weak and dehydrated to go. No acute distress is noted at triage.

## 2016-07-28 ENCOUNTER — Encounter (HOSPITAL_COMMUNITY): Payer: Self-pay | Admitting: Emergency Medicine

## 2016-07-28 ENCOUNTER — Ambulatory Visit (HOSPITAL_COMMUNITY)
Admission: EM | Admit: 2016-07-28 | Discharge: 2016-07-28 | Disposition: A | Discharge status: Home / Self Care | Payer: Medicaid Other | Attending: Internal Medicine | Admitting: Internal Medicine

## 2016-07-28 DIAGNOSIS — H6001 Abscess of right external ear: Secondary | ICD-10-CM | POA: Diagnosis not present

## 2016-07-28 MED ORDER — AMOXICILLIN 400 MG/5ML PO SUSR: ORAL | 0 refills | Status: DC

## 2016-07-28 NOTE — ED Provider Notes (Signed)
CSN: [id number]     Arrival date & time 07/28/16  1304 History   None    Chief Complaint  Patient presents with  . Abscess   (Consider location/radiation/quality/duration/timing/severity/associated sxs/prior Treatment) Patient c/o abscess on right ear lobe.   The history is provided by the patient.  Abscess  Location:  Head/neck Head/neck abscess location:  R ear Abscess quality: fluctuance, painful and redness   Duration:  1 week Progression:  Worsening Pain details:    Quality:  Pressure   Severity:  Moderate   Past Medical History:  Diagnosis Date  . Diabetes mellitus without complication [facility])    Past Surgical History:  Procedure Laterality Date  . BREAST SURGERY     History reviewed. No pertinent family history. Social History  Substance Use Topics  . Smoking status: Current Some Day Smoker  . Smokeless tobacco: Not on file  . Alcohol use Yes   OB History    Gravida Para Term Preterm AB Living   1             SAB TAB Ectopic Multiple Live Births                 Review of Systems  Constitutional: Negative.   HENT: Negative.   Eyes: Negative.   Respiratory: Negative.   Cardiovascular: Negative.   Gastrointestinal: Negative.   Endocrine: Negative.   Genitourinary: Negative.   Musculoskeletal: Negative.   Allergic/Immunologic: Negative.   Neurological: Negative.   Hematological: Negative.   Psychiatric/Behavioral: Negative.     Allergies  Aspirin; Latex; Lortab [hydrocodone-acetaminophen]; Reglan [metoclopramide]; and Shellfish allergy  Home Medications   Prior to Admission medications   Medication Sig Start Date End Date Taking? Authorizing Provider  amoxicillin (AMOXIL) 400 MG/5ML suspension Take 10 ml po bid x 7 days 07/28/16   Deatra Canter, FNP   Meds Ordered and Administered this Visit  Medications - No data to display  BP 124/75 (BP Location: Left Arm)   Pulse 67   Temp 98.4 F (36.9 C) (Oral)   Resp 18   LMP 07/12/2016  (Approximate)   SpO2 100%   Breastfeeding? Unknown  No data found.   Physical Exam  Constitutional: She appears well-developed and well-nourished.  HENT:  Head: Normocephalic and atraumatic.  Right ear lobe with abscess.  Eyes: EOM are normal. Pupils are equal, round, and reactive to light.  Neck: Normal range of motion.  Cardiovascular: Normal rate, regular rhythm and normal heart sounds.   Pulmonary/Chest: Effort normal and breath sounds normal.  Nursing note and vitals reviewed.   Urgent Care Course     .Marland KitchenIncision and Drainage Date/Time: 07/28/2016 8:59 PM Performed by: Deatra Canter Authorized by: Eustace Moore   Consent:    Consent obtained:  Verbal   Consent given by:  Patient   Risks discussed:  Bleeding   Alternatives discussed:  No treatment Location:    Type:  Abscess   Size:  2 cm   Location:  Head   Head/neck location: Right ear lobe. Pre-procedure details:    Skin preparation:  Betadine Anesthesia (see MAR for exact dosages):    Anesthesia method:  Local infiltration   Local anesthetic:  Lidocaine 2% WITH epi Procedure type:    Complexity:  Simple Procedure details:    Needle aspiration: no     Incision types:  Stab incision   Incision depth:  Dermal   Scalpel blade:  11   Drainage:  Purulent and bloody   Drainage  amount:  Moderate   Wound treatment:  Wound left open   Packing materials:  None Post-procedure details:    Patient tolerance of procedure:  Tolerated well, no immediate complications    (including critical care time)  Labs Review Labs Reviewed - No data to display  Imaging Review No results found.   Visual Acuity Review  Right Eye Distance:   Left Eye Distance:   Bilateral Distance:    Right Eye Near:   Left Eye Near:    Bilateral Near:         MDM   1. Abscess of earlobe, right    Amoxicillin 400mg /1015ml 2tsp po bid x 10 days  Incision and Drainage      Deatra CanterOxford, William J, FNP 07/28/16 2101     Deatra Canterxford, William J, FNP 07/28/16 2102

## 2016-07-28 NOTE — ED Triage Notes (Signed)
[facility] a [facility] abscess on her right ear.

## 2017-07-27 ENCOUNTER — Encounter (HOSPITAL_COMMUNITY): Payer: Self-pay | Admitting: Family Medicine

## 2017-07-27 ENCOUNTER — Ambulatory Visit (HOSPITAL_COMMUNITY)
Admission: EM | Admit: 2017-07-27 | Discharge: 2017-07-27 | Disposition: A | Discharge status: Home / Self Care | Payer: Medicaid Other | Attending: Family Medicine | Admitting: Family Medicine

## 2017-07-27 DIAGNOSIS — H6001 Abscess of right external ear: Secondary | ICD-10-CM

## 2017-07-27 DIAGNOSIS — H9201 Otalgia, right ear: Secondary | ICD-10-CM | POA: Diagnosis not present

## 2017-07-27 MED ORDER — LIDOCAINE HCL 2 % IJ SOLN
INTRAMUSCULAR | Status: AC
  Filled 2017-07-27: qty 20

## 2017-07-27 MED ORDER — FLUCONAZOLE 150 MG PO TABS: 150.0000 mg | ORAL_TABLET | Freq: Once | ORAL | 0 refills | Status: AC

## 2017-07-27 MED ORDER — CEPHALEXIN 500 MG PO CAPS: 500.0000 mg | ORAL_CAPSULE | Freq: Four times a day (QID) | ORAL | 0 refills | Status: AC

## 2017-07-27 NOTE — ED Provider Notes (Signed)
[facility]    CSN: [id number] Arrival date & time: 07/27/17  1438     History   Chief Complaint Chief Complaint  Patient presents with  . Abscess    HPI Jessica Benitez is a 32 y.o. female history of DM presenting today for evaluation of swelling to her right earlobe.  States that it has been painful and tender.  Worsened over the past 4 days.  States she had similar swelling last year and since has had hardened area will swell up but is not painful, but this time it is painful.  Previously had it drained.  She is taken some ibuprofen without relief.  HPI  Past Medical History:  Diagnosis Date  . Diabetes mellitus without complication (HCC)     There are no active problems to display for this patient.   Past Surgical History:  Procedure Laterality Date  . BREAST SURGERY      OB History    Gravida  1   Para      Term      Preterm      AB      Living        SAB      TAB      Ectopic      Multiple      Live Births               Home Medications    Prior to Admission medications   Medication Sig Start Date End Date Taking? Authorizing Provider  cephALEXin (KEFLEX) 500 MG capsule Take 1 capsule (500 mg total) by mouth 4 (four) times daily for 5 days. 07/27/17 08/01/17  Wieters, Hallie C, PA-C  fluconazole (DIFLUCAN) 150 MG tablet Take 1 tablet (150 mg total) by mouth once for 1 dose. 07/27/17 07/27/17  Wieters, Junius Creamer, PA-C    Family History History reviewed. No pertinent family history.  Social History Social History   Tobacco Use  . Smoking status: Current Some Day Smoker  Substance Use Topics  . Alcohol use: Yes  . Drug use: No     Allergies   Aspirin; Latex; Lortab [hydrocodone-acetaminophen]; Reglan [metoclopramide]; and Shellfish allergy   Review of Systems Review of Systems  Constitutional: Negative for activity change, appetite change, fatigue and fever.  HENT: Positive for ear pain. Negative for sore throat.    Respiratory: Negative for shortness of breath.   Cardiovascular: Negative for chest pain.  Gastrointestinal: Negative for abdominal pain, nausea and vomiting.  Skin:       Abscess  Neurological: Positive for headaches. Negative for weakness and light-headedness.     Physical Exam Triage Vital Signs ED Triage Vitals  Enc Vitals Group     BP 07/27/17 1456 126/71     Pulse Rate 07/27/17 1456 76     Resp 07/27/17 1456 18     Temp 07/27/17 1456 98.5 F (36.9 C)     Temp src --      SpO2 07/27/17 1456 97 %     Weight --      Height --      Head Circumference --      Peak Flow --      Pain Score 07/27/17 1454 5     Pain Loc --      Pain Edu? --      Excl. in GC? --    No data found.  Updated Vital Signs BP 126/71   Pulse 76   Temp 98.5 F (  36.9 C)   Resp 18   LMP 07/26/2017   SpO2 97%   Visual Acuity Right Eye Distance:   Left Eye Distance:   Bilateral Distance:    Right Eye Near:   Left Eye Near:    Bilateral Near:     Physical Exam  Constitutional: She appears well-developed and well-nourished. No distress.  HENT:  Head: Normocephalic and atraumatic.  Small fluctuant swelling to posterior aspect of right earlobe tenderness to palpation over fluctuance as well as near mastoid  Bilateral ears without tenderness to palpation of external auricle, tragus and mastoid, EAC's without erythema or swelling, TM's with good bony landmarks and cone of light. Non erythematous.  Oral mucosa pink and moist, no tonsillar enlargement or exudate. Posterior pharynx patent and nonerythematous, no uvula deviation or swelling. Normal phonation.   Eyes: Conjunctivae are normal.  Neck: Neck supple.  Cardiovascular: Normal rate.  Pulmonary/Chest: Effort normal. No respiratory distress.  Musculoskeletal: She exhibits no edema.  Neurological: She is alert.  Skin: Skin is warm and dry.  Psychiatric: She has a normal mood and affect.  Nursing note and vitals reviewed.    UC  Treatments / Results  Labs (all labs ordered are listed, but only abnormal results are displayed) Labs Reviewed - No data to display  EKG None  Radiology No results found.  Procedures Incision and Drainage Date/Time: 07/27/2017 3:39 PM Performed by: Wieters, Junius Creamer, PA-C Authorized by: Eustace Moore, MD   Consent:    Consent obtained:  Verbal   Consent given by:  Patient   Risks discussed:  Pain and incomplete drainage   Alternatives discussed:  Alternative treatment Location:    Type:  Abscess   Size:  1   Location:  Head   Head location:  R external ear Pre-procedure details:    Skin preparation:  Betadine Anesthesia (see MAR for exact dosages):    Anesthesia method:  Local infiltration   Local anesthetic:  Lidocaine 2% w/o epi Procedure type:    Complexity:  Simple Procedure details:    Needle aspiration: no     Incision types:  Stab incision   Incision depth:  Subcutaneous   Scalpel blade:  11   Wound management:  Probed and deloculated   Drainage:  Purulent and bloody   Drainage amount:  Moderate   Wound treatment:  Wound left open   Packing materials:  None Post-procedure details:    Patient tolerance of procedure:  Tolerated well, no immediate complications   (including critical care time)  Medications Ordered in UC Medications - No data to display  Initial Impression / Assessment and Plan / UC Course  I have reviewed the triage vital signs and the nursing notes.  Pertinent labs & imaging results that were available during my care of the patient were reviewed by me and considered in my medical decision making (see chart for details).     Abscess I&Ded, possible underlying cyst.  Sent home with Keflex advised to continue warm compresses with massage for further drainage.  Follow-up if symptoms returning.Discussed strict return precautions. Patient verbalized understanding and is agreeable with plan.  Final Clinical Impressions(s) / UC Diagnoses    Final diagnoses:  Abscess of earlobe, right     Discharge Instructions     Apply warm compresses with massage to behind ear to express further drainage  Begin keflex  Follow up if symptoms not improving or worsening     ED Prescriptions    Medication Sig Dispense  Auth. Provider   cephALEXin (KEFLEX) 500 MG capsule Take 1 capsule (500 mg total) by mouth 4 (four) times daily for 5 days. 20 capsule Wieters, Hallie C, PA-C   fluconazole (DIFLUCAN) 150 MG tablet Take 1 tablet (150 mg total) by mouth once for 1 dose. 2 tablet Wieters, Hallie C, PA-C     Controlled Substance Prescriptions [place] Controlled Substance Registry consulted? Not Applicable   Lew DawesWieters, Hallie C, [place]PA-C 07/27/17 1540

## 2017-07-27 NOTE — Discharge Instructions (Addendum)
Apply warm compresses with massage to behind ear to express further drainage  Begin keflex  Follow up if symptoms not improving or worsening

## 2017-07-27 NOTE — ED Triage Notes (Signed)
Pt here for cyst behind the right ear. reports that it has been there for years but over the last 4 days has become more swollen, tender and red. She has had it lanced in the past.

## 2017-08-01 IMAGING — US US OB COMP LESS 14 WK
1 series · 14 of 28 positions shown · non-contrast
Comparison: None.

CLINICAL DATA: Acute abdominal pain, first trimester of pregnancy.

EXAM:
OBSTETRIC <14 WK US AND TRANSVAGINAL OB US
TECHNIQUE: Both transabdominal and transvaginal ultrasound examinations were
performed for complete evaluation of the gestation as well as the
maternal uterus, adnexal regions, and pelvic cul-de-sac.
Transvaginal technique was performed to assess early pregnancy.

[Series 1: us ob comp less 14 wk · 0.22mm/px · 74 acquisitions, 14 frames shown]
[im 3/74]
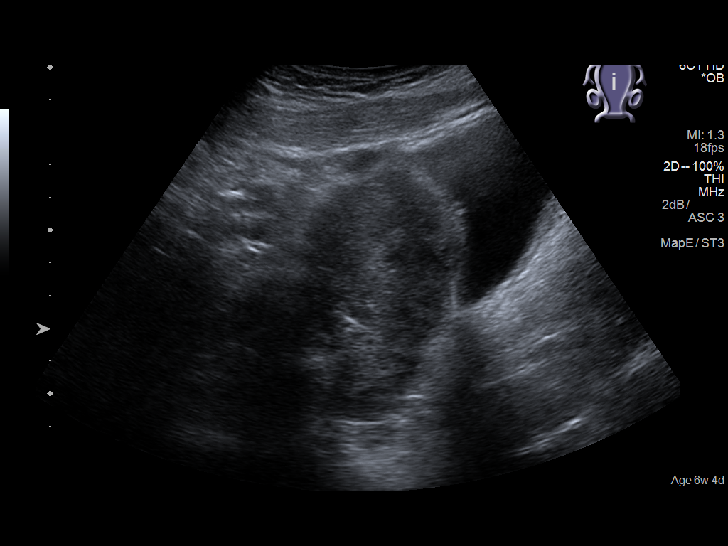
[im 9/74]
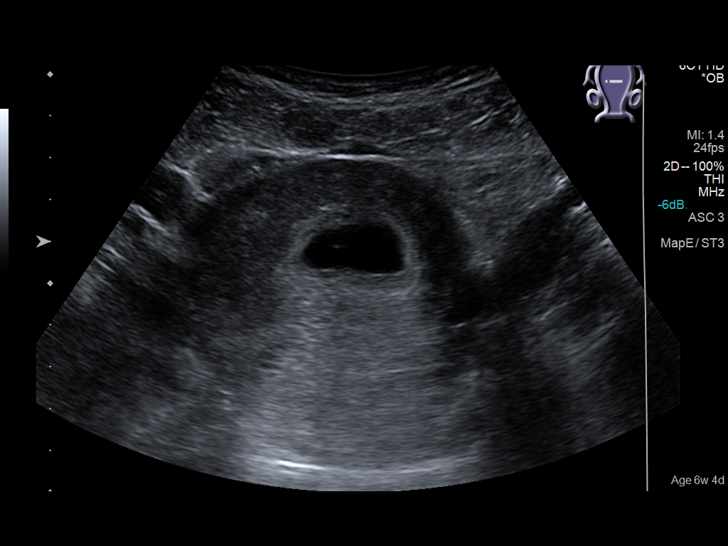
[im 14/74]
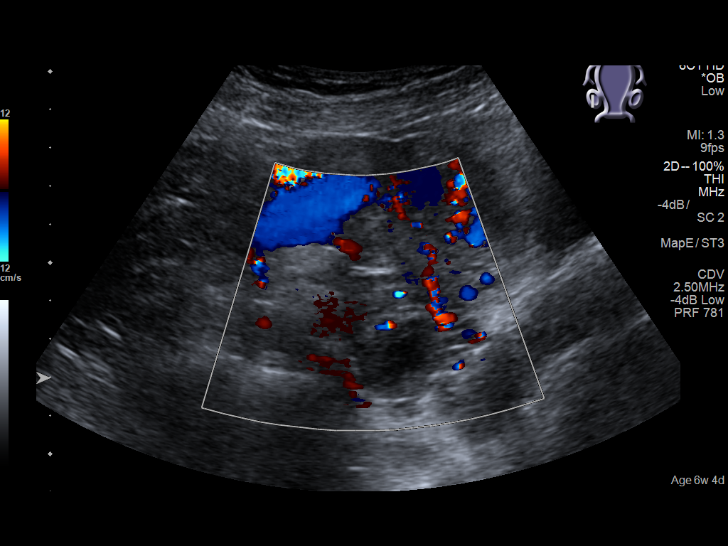
[im 19/74]
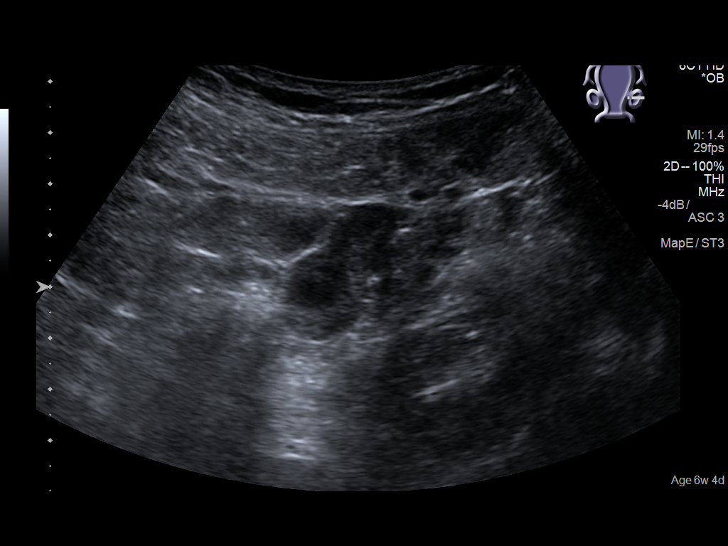
[im 25/74]
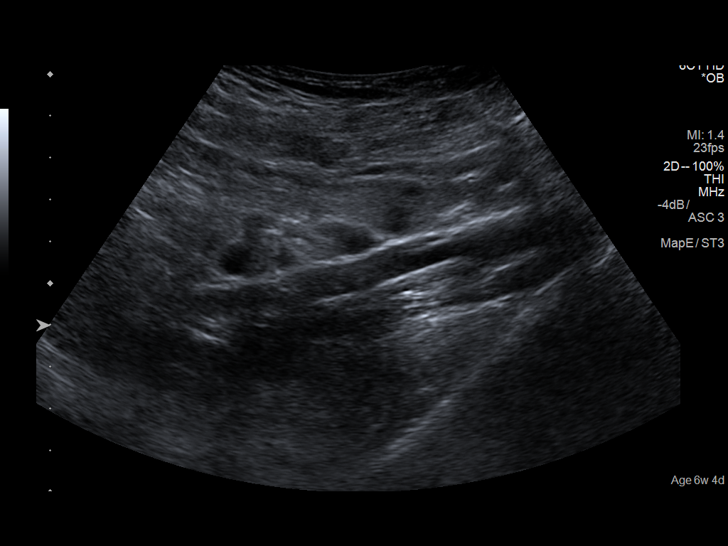
[im 30/74]
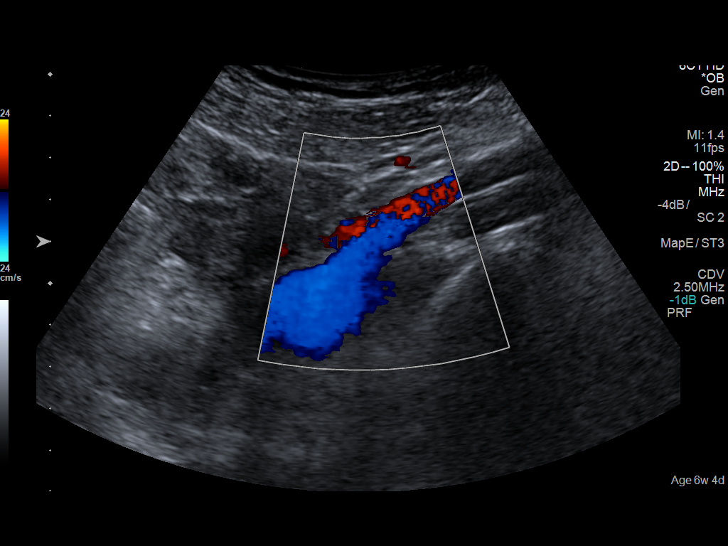
[im 36/74]
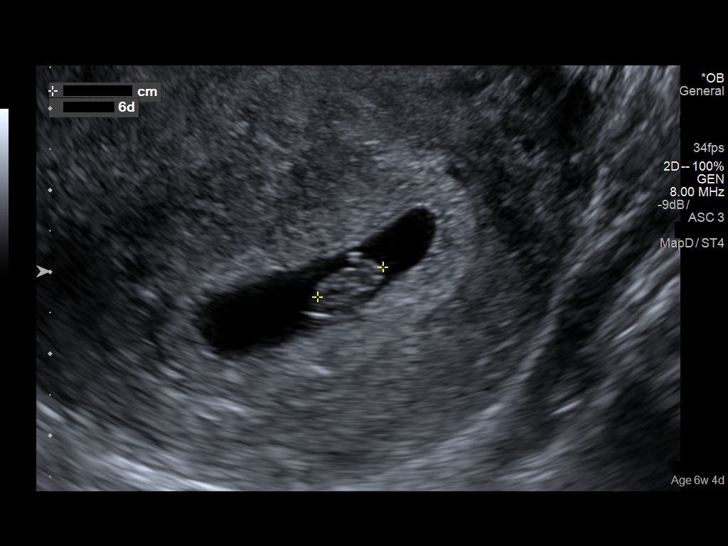
[im 41/74]
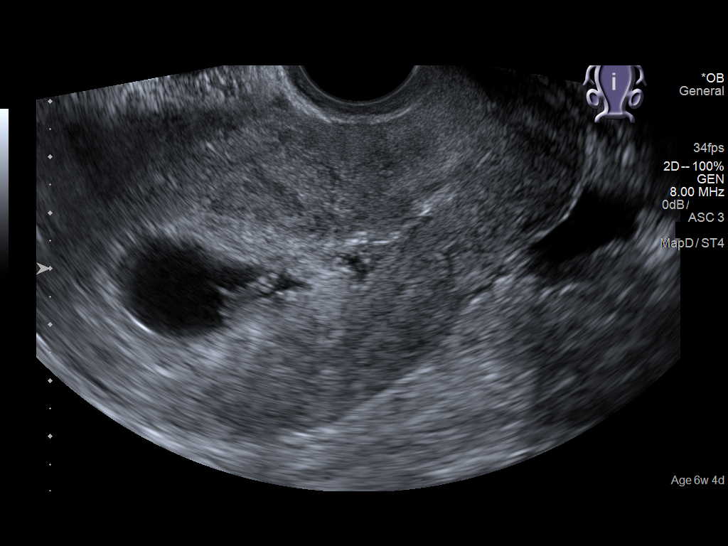
[im 46/74]
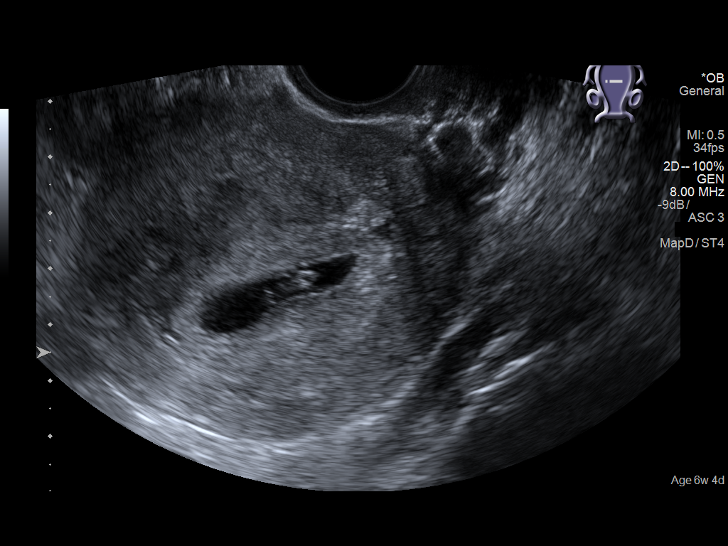
[im 52/74]
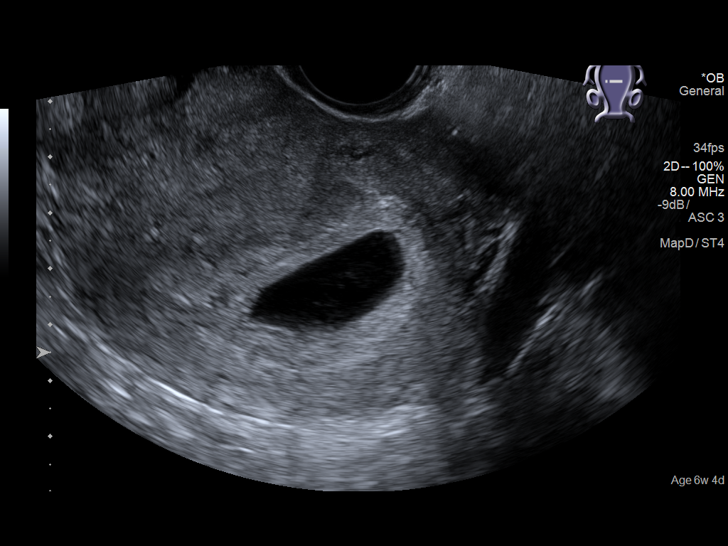
[im 57/74]
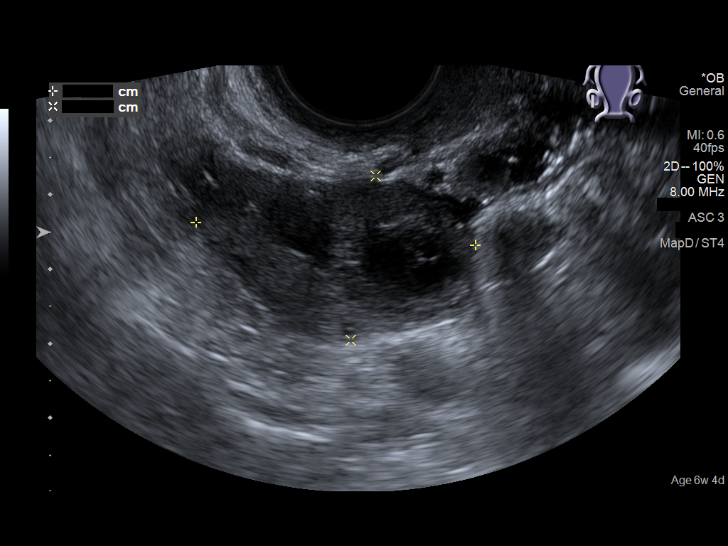
[im 63/74]
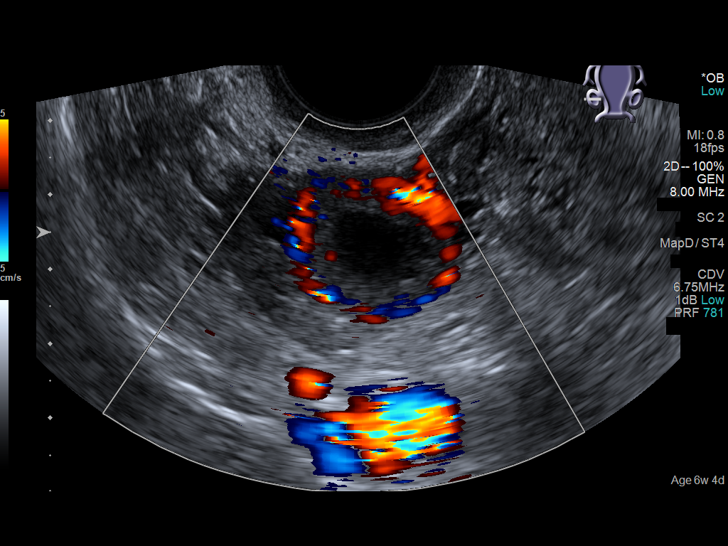
[im 68/74]
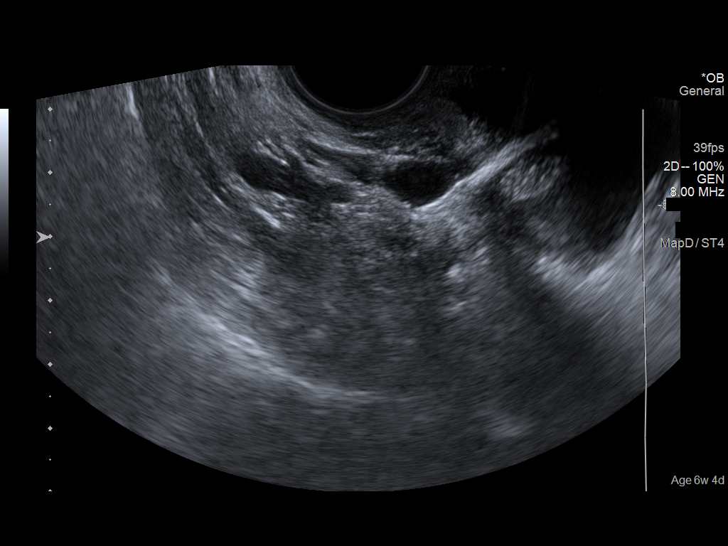
[im 74/74]
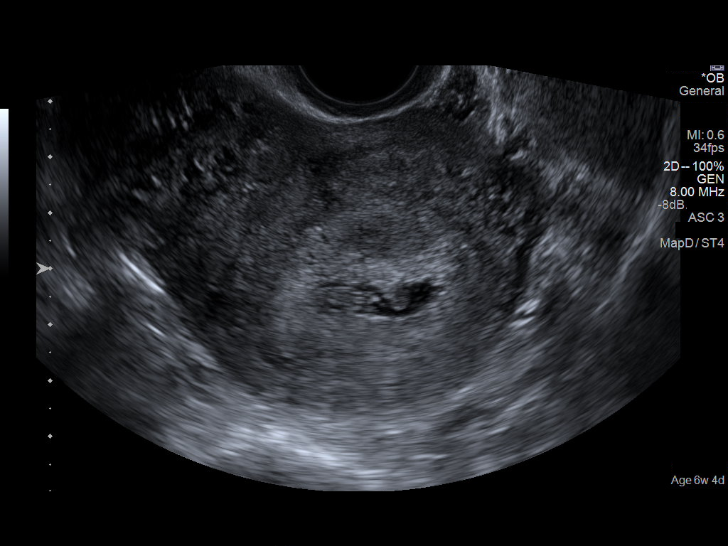

[14 of 28 positions shown; findings below may reference images not displayed]

FINDINGS: Intrauterine gestational sac: Single.

Yolk sac:  Visualized.

Embryo:  Visualized.

Cardiac Activity: Visualized.

Heart Rate: 149  bpm

CRL: 8.8 mm 6 w 6 d US EDC: January 02, 2017.

Subchorionic hemorrhage:  Small subchronic hemorrhage is noted.

Maternal uterus/adnexae: Small amount of free fluid is noted.
Probable right corpus luteum cyst is noted. Left ovary appears
normal.
IMPRESSION: Single live intrauterine gestation of 6 weeks 6 days. Small
subchronic hemorrhage is noted.

## 2017-11-11 ENCOUNTER — Emergency Department (HOSPITAL_COMMUNITY)
Admission: EM | Admit: 2017-11-11 | Discharge: 2017-11-11 | Disposition: A | Discharge status: Home / Self Care | Payer: Medicaid Other | Attending: Emergency Medicine | Admitting: Emergency Medicine

## 2017-11-11 ENCOUNTER — Emergency Department (HOSPITAL_COMMUNITY): Payer: Medicaid Other

## 2017-11-11 ENCOUNTER — Encounter (HOSPITAL_COMMUNITY): Payer: Self-pay | Admitting: Emergency Medicine

## 2017-11-11 DIAGNOSIS — M791 Myalgia, unspecified site: Secondary | ICD-10-CM | POA: Diagnosis not present

## 2017-11-11 DIAGNOSIS — S199XXA Unspecified injury of neck, initial encounter: Secondary | ICD-10-CM | POA: Insufficient documentation

## 2017-11-11 DIAGNOSIS — Y93I9 Activity, other involving external motion: Secondary | ICD-10-CM | POA: Diagnosis not present

## 2017-11-11 DIAGNOSIS — Y92410 Unspecified street and highway as the place of occurrence of the external cause: Secondary | ICD-10-CM | POA: Insufficient documentation

## 2017-11-11 DIAGNOSIS — E119 Type 2 diabetes mellitus without complications: Secondary | ICD-10-CM | POA: Insufficient documentation

## 2017-11-11 DIAGNOSIS — Y998 Other external cause status: Secondary | ICD-10-CM | POA: Insufficient documentation

## 2017-11-11 MED ORDER — KETOROLAC TROMETHAMINE 30 MG/ML IJ SOLN
15.0000 mg | Freq: Once | INTRAMUSCULAR | Status: AC
  Administered 2017-11-11: 15 mg via INTRAMUSCULAR
  Filled 2017-11-11: qty 1
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
## 2017-11-11 NOTE — ED Notes (Signed)
ED Provider at bedside. 

## 2017-11-11 NOTE — ED Notes (Signed)
Patient transported to CT 

## 2017-11-13 ENCOUNTER — Ambulatory Visit (HOSPITAL_COMMUNITY)
Admission: EM | Admit: 2017-11-13 | Discharge: 2017-11-13 | Disposition: A | Discharge status: Home / Self Care | Payer: Medicaid Other | Attending: Family Medicine | Admitting: Family Medicine

## 2017-11-13 ENCOUNTER — Other Ambulatory Visit: Payer: Self-pay

## 2017-11-13 ENCOUNTER — Encounter (HOSPITAL_COMMUNITY): Payer: Self-pay

## 2017-11-13 DIAGNOSIS — S161XXA Strain of muscle, fascia and tendon at neck level, initial encounter: Secondary | ICD-10-CM | POA: Diagnosis not present

## 2017-11-13 MED ORDER — CYCLOBENZAPRINE HCL 10 MG PO TABS: 10.0000 mg | ORAL_TABLET | Freq: Every evening | ORAL | 0 refills | Status: DC | PRN

## 2017-11-13 MED ORDER — DICLOFENAC SODIUM 75 MG PO TBEC: 75.0000 mg | DELAYED_RELEASE_TABLET | Freq: Two times a day (BID) | ORAL | 0 refills | Status: DC
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
## 2017-11-13 NOTE — Discharge Instructions (Addendum)
If you are not improving over the next week to week and a half, you may benefit from physical therapy. Stay as mobile as possible.
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
## 2017-11-19 ENCOUNTER — Encounter (HOSPITAL_COMMUNITY): Payer: Self-pay

## 2017-11-19 ENCOUNTER — Ambulatory Visit (HOSPITAL_COMMUNITY)
Admission: EM | Admit: 2017-11-19 | Discharge: 2017-11-19 | Disposition: A | Discharge status: Home / Self Care | Payer: Medicaid Other | Attending: Family Medicine | Admitting: Family Medicine

## 2017-11-19 DIAGNOSIS — M25512 Pain in left shoulder: Secondary | ICD-10-CM | POA: Diagnosis not present

## 2017-11-19 DIAGNOSIS — M5442 Lumbago with sciatica, left side: Secondary | ICD-10-CM

## 2017-11-19 DIAGNOSIS — M7918 Myalgia, other site: Secondary | ICD-10-CM

## 2017-11-19 MED ORDER — KETOROLAC TROMETHAMINE 30 MG/ML IJ SOLN
INTRAMUSCULAR | Status: AC
  Filled 2017-11-19: qty 1

## 2017-11-19 MED ORDER — DEXAMETHASONE SODIUM PHOSPHATE 10 MG/ML IJ SOLN
10.0000 mg | Freq: Once | INTRAMUSCULAR | Status: AC
  Administered 2017-11-19: 10 mg via INTRAMUSCULAR

## 2017-11-19 MED ORDER — PREDNISONE 50 MG PO TABS: 50.0000 mg | ORAL_TABLET | Freq: Every day | ORAL | 0 refills | Status: AC

## 2017-11-19 MED ORDER — KETOROLAC TROMETHAMINE 30 MG/ML IJ SOLN
30.0000 mg | Freq: Once | INTRAMUSCULAR | Status: AC
  Administered 2017-11-19: 30 mg via INTRAMUSCULAR

## 2017-11-19 MED ORDER — DEXAMETHASONE SODIUM PHOSPHATE 10 MG/ML IJ SOLN
INTRAMUSCULAR | Status: AC
  Filled 2017-11-19: qty 1

## 2017-11-19 MED FILL — predniSONE 50 MG TABS: 50 | 5 days supply | Qty: 5 | Fill #0

## 2017-11-19 NOTE — ED Triage Notes (Signed)
Pt presents with pain in neck, shoulders,, back and lower back area that radiates down to buttocks area.  Prescribed medication is helping; pt also complains of tingling, numbness and weakness in those areas.
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
## 2017-11-19 NOTE — Discharge Instructions (Addendum)
We gave you a shot of Toradol and Decadron today.  This should help with post muscle aches and a tingling feeling you are feeling in your left leg Please begin taking prednisone daily with food for the next 5 days, please take the first dose later beginning tomorrow since we already gave you a shot of Decadron today I have sent in referral to physical therapy for you, they will call you with appointment information Please limit ibuprofen/other anti-inflammatory use while taking the prednisone to decrease risk of stomach upset/GI bleed  Continue icing  Please continue to monitor symptoms and follow-up if developing weakness, unable to control bowel or bladder habits, numbness between thighs, fever, difficulty moving

## 2017-11-25 ENCOUNTER — Ambulatory Visit: Payer: No Typology Code available for payment source | Admitting: Physical Therapy

## 2017-12-10 ENCOUNTER — Ambulatory Visit: Payer: No Typology Code available for payment source | Attending: Physical Therapy | Admitting: Physical Therapy

## 2018-06-02 ENCOUNTER — Ambulatory Visit (HOSPITAL_COMMUNITY)
Admission: EM | Admit: 2018-06-02 | Discharge: 2018-06-02 | Disposition: A | Discharge status: Home / Self Care | Payer: Medicaid Other | Attending: Family Medicine | Admitting: Family Medicine

## 2018-06-02 ENCOUNTER — Encounter (HOSPITAL_COMMUNITY): Payer: Self-pay | Admitting: Emergency Medicine

## 2018-06-02 DIAGNOSIS — L723 Sebaceous cyst: Secondary | ICD-10-CM

## 2018-06-02 NOTE — ED Triage Notes (Signed)
Ear issue.  Seen by provider

## 2018-06-02 NOTE — ED Provider Notes (Signed)
  [facility]   [id number] 06/02/18 Arrival Time: 1326  ASSESSMENT & PLAN:  1. Sebaceous cyst of ear    Incision and Drainage Procedure Note  Anesthesia: PainEaze cold spray  Procedure Details  The procedure, risks and complications have been discussed in detail (including, but not limited to pain and bleeding) with the patient.  The skin induration was prepped and draped in the usual fashion. After adequate local anesthesia, I&D with a #11 blade was performed on the right posterior ear lobe with purulent/cystic material drainage.  EBL: minimal Drains: none Packing: none Condition: Tolerated procedure well Complications: none.  Simple wound care instructions given. May f/u as needed.  Reviewed expectations re: course of current medical issues. Questions answered. Outlined signs and symptoms indicating need for more acute intervention. Patient verbalized understanding. After Visit Summary given.   SUBJECTIVE: Jessica Benitez is a 33 y.o. female who presents with a possible infection of her R posterior ear lobe. Onset gradual, approximately over the past several days without active drainage and without active bleeding. H/O similar in the past; same location. Fever: absent. OTC/home treatment: none.  ROS: As per HPI.  OBJECTIVE:  Vitals:   06/02/18 1351  BP: 112/62  Pulse: 71  Resp: 16  Temp: 99.1 F (37.3 C)  TempSrc: Oral  SpO2: 99%     General appearance: alert; no distress Skin: 1 cm induration of her R posterior ear lobe; tender to touch; no active drainage or bleeding Psychological: alert and cooperative; normal mood and affect  Allergies  Allergen Reactions  . Aspirin   . Latex   . Lortab [Hydrocodone-Acetaminophen]   . Reglan [Metoclopramide]   . Shellfish Allergy     Past Medical History:  Diagnosis Date  . Diabetes mellitus without complication [facility])    Social History   Socioeconomic History  . Marital status: Single    Spouse  name: Not on file  . Number of children: Not on file  . Years of education: Not on file  . Highest education level: Not on file  Occupational History  . Not on file  Social Needs  . Financial resource strain: Not on file  . Food insecurity:    Worry: Not on file    Inability: Not on file  . Transportation needs:    Medical: Not on file    Non-medical: Not on file  Tobacco Use  . Smoking status: Current Some Day Smoker  . Smokeless tobacco: Never Used  Substance and Sexual Activity  . Alcohol use: Yes  . Drug use: No  . Sexual activity: Not on file  Lifestyle  . Physical activity:    Days per week: Not on file    Minutes per session: Not on file  . Stress: Not on file  Relationships  . Social connections:    Talks on phone: Not on file    Gets together: Not on file    Attends religious service: Not on file    Active member of club or organization: Not on file    Attends meetings of clubs or organizations: Not on file    Relationship status: Not on file  Other Topics Concern  . Not on file  Social History Narrative  . Not on file   No family history on file. Past Surgical History:  Procedure Laterality Date  . BREAST SURGERY             Mardella Layman, MD 06/02/18 [phone]
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
## 2021-02-20 ENCOUNTER — Other Ambulatory Visit (HOSPITAL_COMMUNITY): Payer: Self-pay

## 2021-02-20 ENCOUNTER — Other Ambulatory Visit: Payer: Self-pay

## 2021-02-20 ENCOUNTER — Ambulatory Visit (HOSPITAL_COMMUNITY)
Admission: EM | Admit: 2021-02-20 | Discharge: 2021-02-20 | Disposition: A | Discharge status: Home / Self Care | Payer: Medicaid Other | Attending: Emergency Medicine | Admitting: Emergency Medicine

## 2021-02-20 ENCOUNTER — Encounter (HOSPITAL_COMMUNITY): Payer: Self-pay

## 2021-02-20 DIAGNOSIS — J069 Acute upper respiratory infection, unspecified: Secondary | ICD-10-CM

## 2021-02-20 LAB — POC INFLUENZA A AND B ANTIGEN (URGENT CARE ONLY)
INFLUENZA A ANTIGEN, POC: NEGATIVE
INFLUENZA B ANTIGEN, POC: NEGATIVE

## 2021-02-20 MED ORDER — KETOROLAC TROMETHAMINE 30 MG/ML IJ SOLN
INTRAMUSCULAR | Status: AC
  Filled 2021-02-20: qty 1

## 2021-02-20 MED ORDER — DEXAMETHASONE SODIUM PHOSPHATE 10 MG/ML IJ SOLN
10.0000 mg | Freq: Once | INTRAMUSCULAR | Status: AC
  Administered 2021-02-20: 10 mg via INTRAMUSCULAR

## 2021-02-20 MED ORDER — SUMATRIPTAN SUCCINATE 6 MG/0.5ML ~~LOC~~ SOLN
SUBCUTANEOUS | Status: AC
  Filled 2021-02-20: qty 0.5

## 2021-02-20 MED ORDER — SUMATRIPTAN SUCCINATE 6 MG/0.5ML ~~LOC~~ SOLN
6.0000 mg | Freq: Once | SUBCUTANEOUS | Status: AC
  Administered 2021-02-20: 6 mg via SUBCUTANEOUS

## 2021-02-20 MED ORDER — SUMATRIPTAN SUCCINATE 50 MG PO TABS
50.0000 mg | ORAL_TABLET | ORAL | 0 refills | Status: DC | PRN
  Filled 2021-02-20: qty 10, 5d supply, fill #0

## 2021-02-20 MED ORDER — DEXAMETHASONE SODIUM PHOSPHATE 10 MG/ML IJ SOLN
INTRAMUSCULAR | Status: AC
  Filled 2021-02-20: qty 1

## 2021-02-20 MED ORDER — KETOROLAC TROMETHAMINE 30 MG/ML IJ SOLN
30.0000 mg | Freq: Once | INTRAMUSCULAR | Status: AC
  Administered 2021-02-20: 30 mg via INTRAMUSCULAR

## 2021-02-20 NOTE — ED Provider Notes (Signed)
[facility]    CSN: [id number] Arrival date & time: 02/20/21  0848      History   Chief Complaint Chief Complaint  Patient presents with   Headache    HPI Jessica Benitez is a 36 y.o. female.   Patient presents with chills, fever, nasal congestion, rhinorrhea, left-sided sinus pain and pressure, sore throat, shortness of breath and chest tightness and intermittent generalized headaches for 5 days. Last episode of diarrhea this morning. Last episode of vomiting 2 days ago.  Covid test negative x 2. Has attempted advil, tylenol, and otc medications which were not helpful.  History of diabetes.  Past Medical History:  Diagnosis Date   Diabetes mellitus without complication (HCC)     There are no problems to display for this patient.   Past Surgical History:  Procedure Laterality Date   BREAST SURGERY      OB History     Gravida  1   Para      Term      Preterm      AB      Living         SAB      IAB      Ectopic      Multiple      Live Births               Home Medications    Prior to Admission medications   Medication Sig Start Date End Date Taking? Authorizing Provider  cyclobenzaprine (FLEXERIL) 10 MG tablet Take 1 tablet (10 mg total) by mouth at bedtime as needed for muscle spasms. 11/13/17   Mardella Layman, MD  diclofenac (VOLTAREN) 75 MG EC tablet Take 1 tablet (75 mg total) by mouth 2 (two) times daily. 11/13/17   Mardella Layman, MD    Family History No family history on file.  Social History Social History   Tobacco Use   Smoking status: Some Days   Smokeless tobacco: Never  Substance Use Topics   Alcohol use: Yes   Drug use: No     Allergies   Aspirin, Latex, Lortab [hydrocodone-acetaminophen], Reglan [metoclopramide], and Shellfish allergy   Review of Systems Review of Systems  Constitutional:  Positive for chills and fever. Negative for activity change, appetite change, diaphoresis, fatigue and  unexpected weight change.  HENT:  Positive for congestion, rhinorrhea, sinus pressure, sinus pain and sore throat. Negative for dental problem, drooling, ear discharge, ear pain, facial swelling, hearing loss, mouth sores, nosebleeds, postnasal drip, sneezing, tinnitus, trouble swallowing and voice change.   Respiratory:  Positive for chest tightness and shortness of breath. Negative for apnea, cough, choking, wheezing and stridor.   Cardiovascular: Negative.   Gastrointestinal:  Positive for diarrhea, nausea and vomiting. Negative for abdominal distention, abdominal pain, anal bleeding, blood in stool, constipation and rectal pain.  Skin: Negative.   Neurological:  Positive for headaches. Negative for dizziness, tremors, seizures, syncope, facial asymmetry, speech difficulty, weakness, light-headedness and numbness.    Physical Exam Triage Vital Signs ED Triage Vitals  Enc Vitals Group     BP 02/20/21 0906 132/83     Pulse Rate 02/20/21 0906 96     Resp 02/20/21 0906 16     Temp 02/20/21 0906 99.3 F (37.4 C)     Temp Source 02/20/21 0906 Oral     SpO2 02/20/21 0906 100 %     Weight --      Height --      Head Circumference --  Peak Flow --      Pain Score 02/20/21 0907 0     Pain Loc --      Pain Edu? --      Excl. in GC? --    No data found.  Updated Vital Signs BP 132/83 (BP Location: Left Arm)    Pulse 96    Temp 99.3 F (37.4 C) (Oral)    Resp 16    LMP 01/25/2021 (Approximate)    SpO2 100%   Visual Acuity Right Eye Distance:   Left Eye Distance:   Bilateral Distance:    Right Eye Near:   Left Eye Near:    Bilateral Near:     Physical Exam Constitutional:      Appearance: Normal appearance.  HENT:     Head: Normocephalic.     Right Ear: Tympanic membrane, ear canal and external ear normal.     Left Ear: Tympanic membrane, ear canal and external ear normal.     Nose: Congestion and rhinorrhea present.     Right Sinus: No maxillary sinus tenderness or  frontal sinus tenderness.     Left Sinus: Maxillary sinus tenderness and frontal sinus tenderness present.     Mouth/Throat:     Mouth: Mucous membranes are moist.     Pharynx: Posterior oropharyngeal erythema present.  Eyes:     Extraocular Movements: Extraocular movements intact.  Cardiovascular:     Rate and Rhythm: Normal rate and regular rhythm.     Pulses: Normal pulses.     Heart sounds: Normal heart sounds.  Pulmonary:     Effort: Pulmonary effort is normal.     Breath sounds: Normal breath sounds.  Musculoskeletal:     Cervical back: Normal range of motion and neck supple.  Skin:    General: Skin is warm and dry.  Neurological:     Mental Status: She is alert and oriented to person, place, and time. Mental status is at baseline.  Psychiatric:        Mood and Affect: Mood normal.        Behavior: Behavior normal.     UC Treatments / Results  Labs (all labs ordered are listed, but only abnormal results are displayed) Labs Reviewed - No data to display  EKG   Radiology No results found.  Procedures Procedures (including critical care time)  Medications Ordered in UC Medications - No data to display  Initial Impression / Assessment and Plan / UC Course  I have reviewed the triage vital signs and the nursing notes.  Pertinent labs & imaging results that were available during my care of the patient were reviewed by me and considered in my medical decision making (see chart for details).  Viral URI  Home COVID test negative, flu test in office negative, vital signs are stable, patient in no signs of distress, stable for outpatient treatment, Toradol, Imitrex and Decadron given in office for management of headaches, patient has used Imitrex in the past with success as she has history of migraines, will prescribe, may continue use of over-the-counter medications for remaining symptom management, urgent care follow-up as needed, work note given Final Clinical  Impressions(s) / UC Diagnoses   Final diagnoses:  None   Discharge Instructions   None    ED Prescriptions   None    PDMP not reviewed this encounter.   Valinda Hoar, NP 02/20/21 1003

## 2021-02-20 NOTE — Discharge Instructions (Addendum)
Your symptoms today are most likely being caused by a virus and should steadily improve in time it can take up to 7 to 10 days before you truly start to see a turnaround however things will get better  Flu test is negative   May attempt use of over-the-counter Tylenol or ibuprofen for management of headaches, if ineffective then you may attempt use of Imitrex, take 1 tablet then in 2 hours if headache is still present may take second dose, do not exceed more than 4 tablets in a 24-hour period   For cough: honey 1/2 to 1 teaspoon (you can dilute the honey in water or another fluid).  You can also use guaifenesin and dextromethorphan for cough. You can use a humidifier for chest congestion and cough.  If you don't have a humidifier, you can sit in the bathroom with the hot shower running.      For sore throat: try warm salt water gargles, cepacol lozenges, throat spray, warm tea or water with lemon/honey, popsicles or ice, or OTC cold relief medicine for throat discomfort.   For congestion: take a daily anti-histamine like Zyrtec, Claritin, and a oral decongestant, such as pseudoephedrine.  You can also use Flonase 1-2 sprays in each nostril daily.   It is important to stay hydrated: drink plenty of fluids (water, gatorade/powerade/pedialyte, juices, or teas) to keep your throat moisturized and help further relieve irritation/discomfort.

## 2021-02-20 NOTE — ED Triage Notes (Signed)
Pt presents for headache,nausea and vomiting x 2-3 days.

## 2021-04-28 ENCOUNTER — Emergency Department (HOSPITAL_BASED_OUTPATIENT_CLINIC_OR_DEPARTMENT_OTHER)
Admission: EM | Admit: 2021-04-28 | Discharge: 2021-04-29 | Disposition: A | Discharge status: Home / Self Care | Payer: Medicaid Other | Attending: Emergency Medicine | Admitting: Emergency Medicine

## 2021-04-28 ENCOUNTER — Other Ambulatory Visit: Payer: Self-pay

## 2021-04-28 ENCOUNTER — Encounter (HOSPITAL_BASED_OUTPATIENT_CLINIC_OR_DEPARTMENT_OTHER): Payer: Self-pay | Admitting: Pediatrics

## 2021-04-28 DIAGNOSIS — N949 Unspecified condition associated with female genital organs and menstrual cycle: Secondary | ICD-10-CM | POA: Diagnosis present

## 2021-04-28 DIAGNOSIS — E119 Type 2 diabetes mellitus without complications: Secondary | ICD-10-CM | POA: Diagnosis not present

## 2021-04-28 DIAGNOSIS — Z91040 Latex allergy status: Secondary | ICD-10-CM | POA: Diagnosis not present

## 2021-04-28 DIAGNOSIS — Z202 Contact with and (suspected) exposure to infections with a predominantly sexual mode of transmission: Secondary | ICD-10-CM | POA: Insufficient documentation

## 2021-04-28 LAB — PREGNANCY, URINE: Preg Test, Ur: NEGATIVE

## 2021-04-28 MED ORDER — AZITHROMYCIN 250 MG PO TABS
1000.0000 mg | ORAL_TABLET | Freq: Once | ORAL | Status: AC
Start: 2021-04-28 — End: 2021-04-28
  Administered 2021-04-28: 1000 mg via ORAL
  Filled 2021-04-28: qty 4

## 2021-04-28 MED ORDER — FLUCONAZOLE 150 MG PO TABS
150.0000 mg | ORAL_TABLET | Freq: Once | ORAL | Status: AC
  Administered 2021-04-29: 150 mg via ORAL
  Filled 2021-04-28: qty 1

## 2021-04-28 MED ORDER — LIDOCAINE HCL (PF) 1 % IJ SOLN
1.0000 mL | Freq: Once | INTRAMUSCULAR | Status: AC
  Administered 2021-04-28: 1 mL
  Filled 2021-04-28: qty 5

## 2021-04-28 MED ORDER — METRONIDAZOLE 500 MG PO TABS
2000.0000 mg | ORAL_TABLET | Freq: Once | ORAL | Status: AC
Start: 2021-04-29 — End: 2021-04-28
  Administered 2021-04-28: 2000 mg via ORAL
  Filled 2021-04-28: qty 4

## 2021-04-28 MED ORDER — CEFTRIAXONE SODIUM 500 MG IJ SOLR
500.0000 mg | Freq: Once | INTRAMUSCULAR | Status: AC
  Administered 2021-04-28: 500 mg via INTRAMUSCULAR
  Filled 2021-04-28: qty 500

## 2021-04-28 NOTE — ED Provider Notes (Addendum)
?[facility] EM[facility] DEPT ?Provider Note ? ? ?CSN: [id number] ?Arrival date & time: 04/28/21  1901 ? ?  ? ?History ? ?Chief Complaint  ?Patient presents with  ? Menstrual Problem  ? Exposure to STD  ? ? ?Jessica Benitez is a 36 y.o. female. ? ?Patient here for menstrual cycles being kind on and off.  She thinks she is 2 weeks late.  She is sexually active.  Also concerned about STD exposure.  She has no discharge has no pelvic pain.  Not having any vaginal bleeding. ? ?Past medical history is significant for diabetes without complications.  But patient is not on any diabetic medicine currently. ? ? ?  ? ?Home Medications ?Prior to Admission medications   ?Medication Sig Start Date End Date Taking? Authorizing Provider  ?cyclobenzaprine (FLEXERIL) 10 MG tablet Take 1 tablet (10 mg total) by mouth at bedtime as needed for muscle spasms. 11/13/17   Mardella Layman, MD  ?diclofenac (VOLTAREN) 75 MG EC tablet Take 1 tablet (75 mg total) by mouth 2 (two) times daily. 11/13/17   Mardella Layman, MD  ?SUMAtriptan (IMITREX) 50 MG tablet Take 1 tablet (50 mg total) by mouth every 2 (two) hours as needed for migraine. May repeat in 2 hours if headache persists or recurs. 02/20/21   Valinda Hoar, NP  ?   ? ?Allergies    ?Aspirin, Latex, Lortab [hydrocodone-acetaminophen], Reglan [metoclopramide], and Shellfish allergy   ? ?Review of Systems   ?Review of Systems  ?Constitutional:  Negative for chills and fever.  ?HENT:  Negative for rhinorrhea and sore throat.   ?Eyes:  Negative for visual disturbance.  ?Respiratory:  Negative for cough and shortness of breath.   ?Cardiovascular:  Negative for chest pain and leg swelling.  ?Gastrointestinal:  Negative for abdominal pain, diarrhea, nausea and vomiting.  ?Genitourinary:  Positive for menstrual problem. Negative for dysuria, pelvic pain, vaginal bleeding and vaginal discharge.  ?Musculoskeletal:  Negative for back pain and neck pain.  ?Skin:  Negative for rash.   ?Neurological:  Negative for dizziness, light-headedness and headaches.  ?Hematological:  Does not bruise/bleed easily.  ?Psychiatric/Behavioral:  Negative for confusion.   ? ?Physical Exam ?Updated Vital Signs ?BP 124/70 (BP Location: Right Arm)   Pulse 73   Temp 98 ?F (36.7 ?C) (Oral)   Resp 18   Ht 1.6 m (5\' 3" )   Wt 86.2 kg   LMP 03/15/2021 (Approximate) Comment: irregular cycle lately  SpO2 100%   BMI 33.66 kg/m?  ?Physical Exam ?Vitals and nursing note reviewed.  ?Constitutional:   ?   General: She is not in acute distress. ?   Appearance: Normal appearance. She is well-developed.  ?HENT:  ?   Head: Normocephalic and atraumatic.  ?Eyes:  ?   Extraocular Movements: Extraocular movements intact.  ?   Conjunctiva/sclera: Conjunctivae normal.  ?   Pupils: Pupils are equal, round, and reactive to light.  ?Cardiovascular:  ?   Rate and Rhythm: Normal rate and regular rhythm.  ?   Heart sounds: No murmur heard. ?Pulmonary:  ?   Effort: Pulmonary effort is normal. No respiratory distress.  ?   Breath sounds: Normal breath sounds.  ?Abdominal:  ?   Palpations: Abdomen is soft.  ?   Tenderness: There is no abdominal tenderness. There is no guarding.  ?   Comments: Soft nontender.  ?Musculoskeletal:     ?   General: No swelling.  ?   Cervical back: Neck supple.  ?Skin: ?  General: Skin is warm and dry.  ?   Capillary Refill: Capillary refill takes less than 2 seconds.  ?Neurological:  ?   General: No focal deficit present.  ?   Mental Status: She is alert and oriented to person, place, and time.  ?   Cranial Nerves: No cranial nerve deficit.  ?   Sensory: No sensory deficit.  ?Psychiatric:     ?   Mood and Affect: Mood normal.  ? ? ?ED Results / Procedures / Treatments   ?Labs ?(all labs ordered are listed, but only abnormal results are displayed) ?Labs Reviewed  ?WET PREP, GENITAL  ?PREGNANCY, URINE  ?RPR  ?HIV ANTIBODY (ROUTINE TESTING W REFLEX)  ?URINALYSIS, ROUTINE W REFLEX MICROSCOPIC  ?GC/CHLAMYDIA PROBE  AMP ([place]) NOT AT [facility]  ? ? ?EKG ?None ? ?Radiology ?No results found. ? ?Procedures ?Procedures  ? ? ?Medications Ordered in ED ?Medications  ?cefTRIAXone (ROCEPHIN) injection 500 mg (has no administration in time range)  ?lidocaine (PF) (XYLOCAINE) 1 % injection 1 mL (has no administration in time range)  ?azithromycin (ZITHROMAX) tablet 1,000 mg (has no administration in time range)  ? ? ?ED Course/ Medical Decision Making/ A&P ?  ?                        ?Medical Decision Making ?Amount and/or Complexity of Data Reviewed ?Labs: ordered. ? ?Risk ?Prescription drug management. ? ? ?Patient concerned about possibly being pregnant.  Pregnancy test is negative.  Patient's other concern is for STD exposure but she is completely asymptomatic.  We will have patient self swab for wet prep and chlamydia and GC.  Will go ahead and treat for STD exposure with Rocephin and and Zithromax. ? ?Patient also will be given a dose of Flagyl here.  Because she does not want to wait for the wet prep. ? ? ?Final Clinical Impression(s) / ED Diagnoses ?Final diagnoses:  ?STD exposure  ? ? ?Rx / DC Orders ?ED Discharge Orders   ? ? None  ? ?  ? ? ?  ?Vanetta Mulders, MD ?04/28/21 2241 ? ?  ?Vanetta Mulders, MD ?04/28/21 2352 ? ?

## 2021-04-28 NOTE — ED Triage Notes (Addendum)
Reported cycle has been off, "two weeks late"; sexually active. Patient added concern for STD d/t partner exposure. ?

## 2021-04-28 NOTE — Discharge Instructions (Addendum)
Follow-up with your doctors.  Treated here with Rocephin and Zithromax and a dose of Flagyl. ? ?Should cover almost all the STDs.  Recommend following up with your doctors as stated above.  Return for any new or worse symptoms.  As you know pregnancy test was negative. ?

## 2021-04-29 LAB — URINALYSIS, ROUTINE W REFLEX MICROSCOPIC
Bilirubin Urine: NEGATIVE
Glucose, UA: NEGATIVE mg/dL
Ketones, ur: NEGATIVE mg/dL
Leukocytes,Ua: NEGATIVE
Nitrite: POSITIVE — AB
Protein, ur: NEGATIVE mg/dL
Specific Gravity, Urine: 1.013 (ref 1.005–1.030)
pH: 6 (ref 5.0–8.0)

## 2021-04-29 LAB — WET PREP, GENITAL
Clue Cells Wet Prep HPF POC: NONE SEEN
Sperm: NONE SEEN
Trich, Wet Prep: NONE SEEN
WBC, Wet Prep HPF POC: <10 (ref <10)
Yeast Wet Prep HPF POC: NONE SEEN

## 2021-04-29 LAB — HIV ANTIBODY (ROUTINE TESTING W REFLEX): HIV Screen 4th Generation wRfx: NONREACTIVE

## 2021-04-29 NOTE — ED Notes (Signed)
Pt came to desk very irate demanding staff to hurry up and treat/discharge her due to needing to get home. Pt was being verbally abusive to staff and being very disruptive to other Pt's around her. ?

## 2021-04-30 LAB — GC/CHLAMYDIA PROBE AMP (~~LOC~~) NOT AT ARMC
Chlamydia: NEGATIVE
Comment: NEGATIVE
Comment: NORMAL
Neisseria Gonorrhea: NEGATIVE

## 2021-04-30 LAB — RPR: RPR Ser Ql: NONREACTIVE

## 2022-02-26 ENCOUNTER — Other Ambulatory Visit (HOSPITAL_COMMUNITY): Payer: Self-pay

## 2022-02-26 ENCOUNTER — Encounter (HOSPITAL_BASED_OUTPATIENT_CLINIC_OR_DEPARTMENT_OTHER): Payer: Self-pay

## 2022-02-26 ENCOUNTER — Other Ambulatory Visit: Payer: Self-pay

## 2022-02-26 ENCOUNTER — Emergency Department (HOSPITAL_BASED_OUTPATIENT_CLINIC_OR_DEPARTMENT_OTHER)
Admission: EM | Admit: 2022-02-26 | Discharge: 2022-02-26 | Disposition: A | Discharge status: Home / Self Care | Payer: Medicaid Other | Attending: Emergency Medicine | Admitting: Emergency Medicine

## 2022-02-26 DIAGNOSIS — R059 Cough, unspecified: Secondary | ICD-10-CM | POA: Diagnosis present

## 2022-02-26 DIAGNOSIS — E119 Type 2 diabetes mellitus without complications: Secondary | ICD-10-CM | POA: Insufficient documentation

## 2022-02-26 DIAGNOSIS — U071 COVID-19: Secondary | ICD-10-CM | POA: Diagnosis not present

## 2022-02-26 DIAGNOSIS — Z91040 Latex allergy status: Secondary | ICD-10-CM | POA: Diagnosis not present

## 2022-02-26 LAB — RESP PANEL BY RT-PCR (RSV, FLU A&B, COVID)  RVPGX2
Influenza A by PCR: NEGATIVE
Influenza B by PCR: NEGATIVE
Resp Syncytial Virus by PCR: NEGATIVE
SARS Coronavirus 2 by RT PCR: POSITIVE — AB

## 2022-02-26 LAB — CBG MONITORING, ED: Glucose-Capillary: 77 mg/dL (ref 70–99)

## 2022-02-26 LAB — PREGNANCY, URINE: Preg Test, Ur: NEGATIVE

## 2022-02-26 MED ORDER — BENZONATATE 100 MG PO CAPS
100.0000 mg | ORAL_CAPSULE | Freq: Three times a day (TID) | ORAL | 0 refills | Status: DC
  Filled 2022-02-26: qty 21, 7d supply, fill #0

## 2022-02-26 MED ORDER — DIPHENHYDRAMINE HCL 50 MG/ML IJ SOLN
12.5000 mg | Freq: Once | INTRAMUSCULAR | Status: AC
  Administered 2022-02-26: 12.5 mg via INTRAVENOUS
  Filled 2022-02-26: qty 1

## 2022-02-26 MED ORDER — BENZONATATE 100 MG PO CAPS
100.0000 mg | ORAL_CAPSULE | Freq: Three times a day (TID) | ORAL | 0 refills | Status: AC
Start: 2022-02-26 — End: ?

## 2022-02-26 MED ORDER — PROCHLORPERAZINE EDISYLATE 10 MG/2ML IJ SOLN
10.0000 mg | Freq: Once | INTRAMUSCULAR | Status: AC
  Administered 2022-02-26: 10 mg via INTRAVENOUS
  Filled 2022-02-26: qty 2

## 2022-02-26 NOTE — Discharge Instructions (Signed)
It was a pleasure taking care of you today.  As discussed, your COVID test was positive.  I am sending you home with cough medication.  Take as needed.  Continue to take ibuprofen or Tylenol as needed for fever and bodyaches.  Please follow-up with PCP if symptoms not improve over the next week.  Return to the ER for new or worsening symptoms.

## 2022-02-26 NOTE — ED Triage Notes (Signed)
Pt reports cold symptoms x 3 days. Cough, congestion, nausea, generalized body aches.

## 2022-02-26 NOTE — ED Provider Notes (Signed)
[facility] EMERGENCY DEPT Provider Note   CSN: [id number] Arrival date & time: 02/26/22  1237     History  Chief Complaint  Patient presents with   URI    Jessica Benitez is a 37 y.o. female with a past medical history significant for diabetes who presents to the ED due to cough, nasal congestion, nausea, vomiting, and bodyaches x 3 days.  She admits to 4 episodes of nonbloody, nonbilious emesis earlier today relieved by Zofran.  Notes her child was sick last week with similar symptoms.  Denies abdominal pain.  No urinary or vaginal symptoms.  Admits to subjective fever.  Also endorses a headache.  History obtained from patient and past medical records. No interpreter used during encounter.       Home Medications Prior to Admission medications   Medication Sig Start Date End Date Taking? Authorizing Provider  benzonatate (TESSALON) 100 MG capsule Take 1 capsule (100 mg total) by mouth every 8 (eight) hours. 02/26/22  Yes Ebone Alcivar, Druscilla Brownie, PA-C  cyclobenzaprine (FLEXERIL) 10 MG tablet Take 1 tablet (10 mg total) by mouth at bedtime as needed for muscle spasms. 11/13/17   Vanessa Kick, MD  diclofenac (VOLTAREN) 75 MG EC tablet Take 1 tablet (75 mg total) by mouth 2 (two) times daily. 11/13/17   Vanessa Kick, MD  SUMAtriptan (IMITREX) 50 MG tablet Take 1 tablet (50 mg total) by mouth every 2 (two) hours as needed for migraine. May repeat in 2 hours if headache persists or recurs. 02/20/21   Hans Eden, NP      Allergies    Aspirin, Latex, Lortab [hydrocodone-acetaminophen], Reglan [metoclopramide], and Shellfish allergy    Review of Systems   Review of Systems  Constitutional:  Positive for chills and fever.  HENT:  Positive for congestion, rhinorrhea and sore throat.   Gastrointestinal:  Positive for nausea and vomiting. Negative for abdominal pain.  Neurological:  Positive for headaches.  All other systems reviewed and are negative.   Physical  Exam Updated Vital Signs BP 125/72   Pulse 93   Temp 98.6 F (37 C) (Oral)   Resp 18   Ht 5\' 4"  (1.626 m)   Wt 86.2 kg   LMP 02/14/2022   SpO2 98%   BMI 32.61 kg/m  Physical Exam Vitals and nursing note reviewed.  Constitutional:      General: She is not in acute distress.    Appearance: She is not ill-appearing.  HENT:     Head: Normocephalic.  Eyes:     Pupils: Pupils are equal, round, and reactive to light.  Cardiovascular:     Rate and Rhythm: Normal rate and regular rhythm.     Pulses: Normal pulses.     Heart sounds: Normal heart sounds. No murmur heard.    No friction rub. No gallop.  Pulmonary:     Effort: Pulmonary effort is normal.     Breath sounds: Normal breath sounds.  Abdominal:     General: Abdomen is flat. There is no distension.     Palpations: Abdomen is soft.     Tenderness: There is no abdominal tenderness. There is no guarding or rebound.  Musculoskeletal:        General: Normal range of motion.     Cervical back: Neck supple.  Skin:    General: Skin is warm and dry.  Neurological:     General: No focal deficit present.     Mental Status: She is alert.  Psychiatric:  Mood and Affect: Mood normal.        Behavior: Behavior normal.     ED Results / Procedures / Treatments   Labs (all labs ordered are listed, but only abnormal results are displayed) Labs Reviewed  RESP PANEL BY RT-PCR (RSV, FLU A&B, COVID)  RVPGX2 - Abnormal; Notable for the following components:      Result Value   SARS Coronavirus 2 by RT PCR POSITIVE (*)    All other components within normal limits  PREGNANCY, URINE  CBG MONITORING, ED    EKG None  Radiology No results found.  Procedures Procedures    Medications Ordered in ED Medications  prochlorperazine (COMPAZINE) injection 10 mg (10 mg Intravenous Given 02/26/22 1438)  diphenhydrAMINE (BENADRYL) injection 12.5 mg (12.5 mg Intravenous Given 02/26/22 1438)    ED Course/ Medical Decision Making/  A&P Clinical Course as of 02/26/22 1458  Tue Feb 26, 2022  1431 SARS Coronavirus 2 by RT PCR(!): POSITIVE [CA]  1447 Glucose-Capillary: 77 [CA]    Clinical Course User Index [CA] Suzy Bouchard, PA-C                             Medical Decision Making Amount and/or Complexity of Data Reviewed Labs: ordered. Decision-making details documented in ED Course.  Risk Prescription drug management.   37 year old female presents to the ED due to cough, nasal congestion, nausea, myalgias x 3 days.  Child sick with similar symptoms last week.  No chest pain or shortness of breath.  Upon arrival, vitals all within normal limits.  Patient in no acute distress.  Benign physical exam.  Abdomen soft, nondistended, nontender.  Low suspicion for acute abdomen.  Lungs clear to auscultation bilaterally.  Low suspicion for pneumonia.  RVP ordered.  Suspect viral etiology.  Patient given migraine cocktail.  COVID positive. Reassessed patient after migraine cocktail who admits to improvement in symptoms.  CBG 77. Low suspicion for DKA. Patient able to tolerate p.o. at bedside.  Suspect symptoms related to COVID infection.  Patient discharged with symptomatic treatment.  Quarantine guidelines discussed with patient. Strict ED precautions discussed with patient. Patient states understanding and agrees to plan. Patient discharged home in no acute distress and stable vitals  No PCP Hx. DM       Final Clinical Impression(s) / ED Diagnoses Final diagnoses:  COVID-19 virus infection    Rx / DC Orders ED Discharge Orders          Ordered    benzonatate (TESSALON) 100 MG capsule  Every 8 hours        02/26/22 1457              Suzy Bouchard, PA-C 02/26/22 [place], DO 02/26/22 1509

## 2023-07-29 ENCOUNTER — Ambulatory Visit (HOSPITAL_COMMUNITY)
Admission: EM | Admit: 2023-07-29 | Discharge: 2023-07-29 | Disposition: A | Discharge status: Home / Self Care | Attending: Family Medicine | Admitting: Family Medicine

## 2023-07-29 ENCOUNTER — Encounter (HOSPITAL_COMMUNITY): Payer: Self-pay | Admitting: *Deleted

## 2023-07-29 ENCOUNTER — Other Ambulatory Visit: Payer: Self-pay

## 2023-07-29 ENCOUNTER — Telehealth (HOSPITAL_COMMUNITY): Payer: Self-pay | Admitting: *Deleted

## 2023-07-29 DIAGNOSIS — L03111 Cellulitis of right axilla: Secondary | ICD-10-CM

## 2023-07-29 MED ORDER — KETOROLAC TROMETHAMINE 30 MG/ML IJ SOLN
INTRAMUSCULAR | Status: AC
  Filled 2023-07-29: qty 1

## 2023-07-29 MED ORDER — AMOXICILLIN-POT CLAVULANATE 600-42.9 MG/5ML PO SUSR: 900.0000 mg | Freq: Two times a day (BID) | ORAL | 0 refills | Status: AC

## 2023-07-29 MED ORDER — FLUCONAZOLE 150 MG PO TABS: 150.0000 mg | ORAL_TABLET | ORAL | 0 refills | Status: AC

## 2023-07-29 MED ORDER — AMOXICILLIN-POT CLAVULANATE 875-125 MG PO TABS: 1.0000 | ORAL_TABLET | Freq: Two times a day (BID) | ORAL | 0 refills | Status: DC

## 2023-07-29 MED ORDER — KETOROLAC TROMETHAMINE 10 MG PO TABS: 10.0000 mg | ORAL_TABLET | Freq: Four times a day (QID) | ORAL | 0 refills | Status: DC | PRN

## 2023-07-29 MED ORDER — KETOROLAC TROMETHAMINE 30 MG/ML IJ SOLN: 30.0000 mg | Freq: Once | INTRAMUSCULAR | Status: DC

## 2023-07-29 NOTE — Telephone Encounter (Signed)
 See note

## 2023-07-29 NOTE — ED Triage Notes (Signed)
 PT reports she has a Boil under rt arm for 3 weeks . Site has not drained.

## 2023-07-29 NOTE — Discharge Instructions (Signed)
 You have been given a shot of Toradol  30 mg today.  Ketorolac  10 mg tablets--take 1 tablet every 6 hours as needed for pain.  This is the same medicine that is in the shot we just gave you  Take amoxicillin -clavulanate 875 mg--1 tab twice daily with food for 7 days  Continue to do warm compresses  You can use the QR code/website at the back of the summary paperwork to schedule yourself a new patient appointment with primary care

## 2023-07-29 NOTE — ED Notes (Signed)
 Pt reported she did not want the Ketorolac  injection. Pt wanted to take the pain med faxed to her pharmacy.

## 2023-07-29 NOTE — ED Provider Notes (Addendum)
 [facility]    CSN: [id number] Arrival date & time: 07/29/23  1808      History   Chief Complaint Chief Complaint  Patient presents with   Abscess    HPI Jessica Benitez is a 38 y.o. female.    Abscess Here for swelling and pain in her right axilla.  She has noticed a small little area like a pimple beginning about 2 or 3 weeks ago.  Then in the last few days it has gotten more tender and today it was really painful.  No fever or chills  She is allergic to hydrocodone and Reglan.  Aspirin causes an upset stomach.  I can see that she has tolerated Toradol  in the past.  Last menstrual cycle was May 27.  Past Medical History:  Diagnosis Date   Diabetes mellitus without complication (HCC)     There are no active problems to display for this patient.   Past Surgical History:  Procedure Laterality Date   BREAST SURGERY      OB History     Gravida  1   Para      Term      Preterm      AB      Living         SAB      IAB      Ectopic      Multiple      Live Births               Home Medications    Prior to Admission medications   Medication Sig Start Date End Date Taking? Authorizing Provider  amoxicillin -clavulanate (AUGMENTIN ES-600) 600-42.9 MG/5ML suspension Take 7.5 mLs (900 mg total) by mouth 2 (two) times daily for 7 days. With food 07/29/23 08/05/23 Yes Mekaylah Klich, Paige Boatman, MD  fluconazole  (DIFLUCAN ) 150 MG tablet Take 1 tablet (150 mg total) by mouth every 3 (three) days for 2 doses. 07/29/23 08/02/23 Yes Ann Keto, MD  ketorolac  (TORADOL ) 10 MG tablet Take 1 tablet (10 mg total) by mouth every 6 (six) hours as needed (pain). 07/29/23  Yes Ann Keto, MD    Family History History reviewed. No pertinent family history.  Social History Social History   Tobacco Use   Smoking status: Some Days   Smokeless tobacco: Never  Substance Use Topics   Alcohol use: Yes   Drug use: No     Allergies    Aspirin, Latex, Lortab [hydrocodone-acetaminophen], Reglan [metoclopramide], and Shellfish allergy   Review of Systems Review of Systems   Physical Exam Triage Vital Signs ED Triage Vitals  Encounter Vitals Group     BP 07/29/23 1827 115/78     Girls Systolic BP Percentile --      Girls Diastolic BP Percentile --      Boys Systolic BP Percentile --      Boys Diastolic BP Percentile --      Pulse Rate 07/29/23 1827 84     Resp 07/29/23 1827 20     Temp 07/29/23 1827 98.7 F (37.1 C)     Temp src --      SpO2 07/29/23 1827 98 %     Weight --      Height --      Head Circumference --      Peak Flow --      Pain Score 07/29/23 1825 10     Pain Loc --      Pain Education --  Exclude from Growth Chart --    No data found.  Updated Vital Signs BP 115/78   Pulse 84   Temp 98.7 F (37.1 C)   Resp 20   LMP 07/15/2023 (Approximate)   SpO2 98%   Visual Acuity Right Eye Distance:   Left Eye Distance:   Bilateral Distance:    Right Eye Near:   Left Eye Near:    Bilateral Near:     Physical Exam Vitals reviewed.  Constitutional:      General: She is not in acute distress.    Appearance: She is not ill-appearing, toxic-appearing or diaphoretic.   Skin:    Coloration: Skin is not pale.     Comments: In her right axilla there is an area of induration about 1 cm in diameter.  It is firm and slightly raised.  There is no fluctuance at this time     Neurological:     Mental Status: She is alert and oriented to person, place, and time.   Psychiatric:        Behavior: Behavior normal.      UC Treatments / Results  Labs (all labs ordered are listed, but only abnormal results are displayed) Labs Reviewed - No data to display  EKG   Radiology No results found.  Procedures Procedures (including critical care time)  Medications Ordered in UC Medications  ketorolac  (TORADOL ) 30 MG/ML injection 30 mg (30 mg Intramuscular Not Given 07/29/23 1934)     Initial Impression / Assessment and Plan / UC Course  I have reviewed the triage vital signs and the nursing notes.  Pertinent labs & imaging results that were available during my care of the patient were reviewed by me and considered in my medical decision making (see chart for details).      I do not find that there is an area of abscess to be drained at this time.  Augmentin is sent in for the infection and Toradol  injection is given here and Toradol  tablets are sent to the pharmacy.  She is intolerant of aspirin and not allergic to it, so Toradol  should be safe to receive today.   she will continue warm compresses.    She is given instructions on how to set up primary care appointment   Patient ended up declining the Toradol  injection.  This was relayed to me after she had already been discharged.  She called back and wanted to have liquid antibiotics instead of pills as the Augmentin pills were big.  She had also requested that I do a Diflucan  prescription for potential yeast that might develop with antibiotics, and I forgot to do so.  Fluconazole  prescription is sent in.  Augmentin ES liquid is also sent in.  Final Clinical Impressions(s) / UC Diagnoses   Final diagnoses:  Cellulitis of right axilla     Discharge Instructions      You have been given a shot of Toradol  30 mg today.  Ketorolac  10 mg tablets--take 1 tablet every 6 hours as needed for pain.  This is the same medicine that is in the shot we just gave you  Take amoxicillin -clavulanate 875 mg--1 tab twice daily with food for 7 days  Continue to do warm compresses  You can use the QR code/website at the back of the summary paperwork to schedule yourself a new patient appointment with primary care      ED Prescriptions     Medication Sig Dispense Auth. Provider   amoxicillin -clavulanate (AUGMENTIN)  875-125 MG tablet  (Status: Discontinued) Take 1 tablet by mouth 2 (two) times daily for 7 days. 14  tablet Sharonann Malbrough K, MD   ketorolac  (TORADOL ) 10 MG tablet Take 1 tablet (10 mg total) by mouth every 6 (six) hours as needed (pain). 20 tablet Yona Stansbury, Paige Boatman, MD   amoxicillin -clavulanate (AUGMENTIN ES-600) 600-42.9 MG/5ML suspension Take 7.5 mLs (900 mg total) by mouth 2 (two) times daily for 7 days. With food 105 mL Ann Keto, MD   fluconazole  (DIFLUCAN ) 150 MG tablet Take 1 tablet (150 mg total) by mouth every 3 (three) days for 2 doses. 2 tablet Ellsworth Haas Paige Boatman, MD      PDMP not reviewed this encounter.   Ann Keto, MD 07/29/23 Sarah Cumber    Ann Keto, MD 07/29/23 1944    Ann Keto, MD 07/29/23 [phone]

## 2023-07-29 NOTE — ED Notes (Signed)
 PT called at the request of Provider . Pt informed Pharmacy may not release Toradol  pills because she did not receive the IM injection.

## 2023-12-18 ENCOUNTER — Other Ambulatory Visit (HOSPITAL_COMMUNITY): Payer: Self-pay

## 2023-12-18 MED ORDER — AMOXICILLIN 400 MG/5ML PO SUSR
500.0000 mg | Freq: Four times a day (QID) | ORAL | 0 refills | Status: AC
  Filled 2023-12-18: qty 200, 7d supply, fill #0

## 2023-12-18 MED ORDER — FLUCONAZOLE 100 MG PO TABS
200.0000 mg | ORAL_TABLET | Freq: Once | ORAL | 0 refills | Status: AC
  Filled 2023-12-18: qty 15, 7d supply, fill #0

## 2023-12-18 MED ORDER — IBUPROFEN 800 MG PO TABS
800.0000 mg | ORAL_TABLET | Freq: Three times a day (TID) | ORAL | 0 refills | Status: DC
  Filled 2023-12-18: qty 28, 10d supply, fill #0

## 2024-01-14 ENCOUNTER — Ambulatory Visit (INDEPENDENT_AMBULATORY_CARE_PROVIDER_SITE_OTHER)

## 2024-01-14 ENCOUNTER — Ambulatory Visit (HOSPITAL_COMMUNITY): Admission: EM | Admit: 2024-01-14 | Discharge: 2024-01-14 | Disposition: A | Discharge status: Home / Self Care

## 2024-01-14 ENCOUNTER — Encounter (HOSPITAL_COMMUNITY): Payer: Self-pay | Admitting: Emergency Medicine

## 2024-01-14 DIAGNOSIS — S93601A Unspecified sprain of right foot, initial encounter: Secondary | ICD-10-CM | POA: Diagnosis not present

## 2024-01-14 DIAGNOSIS — M79671 Pain in right foot: Secondary | ICD-10-CM

## 2024-01-14 NOTE — Discharge Instructions (Addendum)
  1. Sprain of right foot, initial encounter (Primary) - DG Foot Complete Right x-ray completed in UC shows no acute fracture or dislocation of the right ankle or foot.  Most likely foot sprain. - Apply ace wrap to right ankle for protection and compression of foot and ankle. - Apply CAM boot to right foot for protection of ankle/foot sprain until symptoms improve. - Continue taking over-the-counter ibuprofen  600 to 800 mg every 8 hours or Tylenol 1000 mg every 6-8 hours for pain and inflammation to right ankle/foot.  -Continue to monitor symptoms for any change in severity if there is any escalation of current symptoms or development of new symptoms follow-up in ER for further evaluation and management.

## 2024-01-14 NOTE — ED Provider Notes (Signed)
 [facility]  Note:  This document was prepared using Conservation officer, historic buildings and may include unintentional dictation errors.  MRN: [id number] DOB: 1985/08/09  Subjective:   Jessica Benitez is a 38 y.o. female presenting for evaluation of right foot pain after injury that occurred last night at work.  Patient reports that she was filling a bucket when she tripped causing her ankle to twist.  Patient reports that pain radiates along the bottom lateral edge of the foot and up into the posterior foot below the lateral malleolus.  Patient has been taking dual action over-the-counter pain reliever with minimal improvement.  Patient reports that she does not believe that she can stand for 8 hours during her shift today due to pain and inflammation to the ankle.  No current facility-administered medications for this encounter. No current outpatient medications on file.   Allergies  Allergen Reactions   Aspirin    Latex    Lortab [Hydrocodone-Acetaminophen]    Reglan [Metoclopramide]    Shellfish Allergy     Past Medical History:  Diagnosis Date   Diabetes mellitus without complication (HCC)      Past Surgical History:  Procedure Laterality Date   BREAST SURGERY      No family history on file.  Social History   Tobacco Use   Smoking status: Some Days   Smokeless tobacco: Never  Substance Use Topics   Alcohol use: Yes   Drug use: No    ROS Refer to HPI for ROS details.  Objective:    Vitals: BP 114/76 (BP Location: Right Arm)   Pulse 72   Temp 98.1 F (36.7 C) (Oral)   Resp 16   LMP 12/31/2023 (Exact Date)   SpO2 99%   Physical Exam Vitals and nursing note reviewed.  Constitutional:      General: She is not in acute distress.    Appearance: Normal appearance. She is well-developed. She is not ill-appearing or toxic-appearing.  HENT:     Head: Normocephalic and atraumatic.  Cardiovascular:     Rate and Rhythm: Normal rate.   Pulmonary:     Effort: Pulmonary effort is normal. No respiratory distress.  Musculoskeletal:     Right foot: Decreased range of motion. Normal capillary refill. Tenderness and bony tenderness present. No swelling or deformity. Normal pulse.       Feet:  Skin:    General: Skin is warm and dry.  Neurological:     General: No focal deficit present.     Mental Status: She is alert and oriented to person, place, and time.  Psychiatric:        Mood and Affect: Mood normal.        Behavior: Behavior normal.     Procedures  No results found for this or any previous visit (from the past 24 hours).  Assessment and Plan :     Discharge Instructions       1. Sprain of right foot, initial encounter (Primary) - DG Foot Complete Right x-ray completed in UC shows no acute fracture or dislocation of the right ankle or foot.  Most likely foot sprain. - Apply ace wrap to right ankle for protection and compression of foot and ankle. - Apply CAM boot to right foot for protection of ankle/foot sprain until symptoms improve. - Continue taking over-the-counter ibuprofen  600 to 800 mg every 8 hours or Tylenol 1000 mg every 6-8 hours for pain and inflammation to right ankle/foot.  -Continue to monitor symptoms for  any change in severity if there is any escalation of current symptoms or development of new symptoms follow-up in ER for further evaluation and management.      Lott Seelbach B Hassani Sliney   Brit Carbonell, [place], [place] 01/14/24 1452

## 2024-01-14 NOTE — ED Triage Notes (Signed)
 Pt c/o right foot pain for some time. Then last night at work reports rolled her foot/ankle when getting water in mop bucket. Reports that  she doesn't believe will be able to stand for 8 hours shift today. Reports pain radiates from bottom of foot to ankle. Took dual action OTC medication.

## 2024-02-25 ENCOUNTER — Encounter: Payer: Self-pay | Admitting: Obstetrics & Gynecology

## 2024-02-25 ENCOUNTER — Ambulatory Visit: Admitting: Obstetrics & Gynecology

## 2024-02-25 ENCOUNTER — Other Ambulatory Visit (HOSPITAL_COMMUNITY)
Admission: RE | Admit: 2024-02-25 | Discharge: 2024-02-25 | Disposition: A | Source: Ambulatory Visit | Attending: Obstetrics & Gynecology | Admitting: Obstetrics & Gynecology

## 2024-02-25 VITALS — BP 150/90 | HR 73 | Ht 64.0 in | Wt 194.2 lb

## 2024-02-25 DIAGNOSIS — N920 Excessive and frequent menstruation with regular cycle: Secondary | ICD-10-CM

## 2024-02-25 DIAGNOSIS — Z1151 Encounter for screening for human papillomavirus (HPV): Secondary | ICD-10-CM

## 2024-02-25 DIAGNOSIS — N946 Dysmenorrhea, unspecified: Secondary | ICD-10-CM | POA: Diagnosis not present

## 2024-02-25 DIAGNOSIS — Z01419 Encounter for gynecological examination (general) (routine) without abnormal findings: Secondary | ICD-10-CM

## 2024-02-25 DIAGNOSIS — N898 Other specified noninflammatory disorders of vagina: Secondary | ICD-10-CM | POA: Insufficient documentation

## 2024-02-25 DIAGNOSIS — N921 Excessive and frequent menstruation with irregular cycle: Secondary | ICD-10-CM

## 2024-02-25 DIAGNOSIS — N926 Irregular menstruation, unspecified: Secondary | ICD-10-CM

## 2024-02-25 DIAGNOSIS — Z3202 Encounter for pregnancy test, result negative: Secondary | ICD-10-CM | POA: Diagnosis not present

## 2024-02-25 LAB — POCT URINE PREGNANCY: Preg Test, Ur: NEGATIVE

## 2024-02-25 NOTE — Progress Notes (Signed)
 Subjective:     Jessica Benitez is a 39 y.o. female here for a routine exam.  Patient's last menstrual period was 01/16/2024 (exact date). [id number] Birth Control Method:  Plan B Menstrual Calendar(currently): 4-6 weeks, bleeds 7 days heavy saturates pads in 30 minutes  Current complaints: heavy painful periods.   Current acute medical issues:     Recent Gynecologic History Patient's last menstrual period was 01/16/2024 (exact date). Last Pap: unsure years,  normal Last mammogram: na,  sister just diagnosed with Stage IV breast cancer  History reviewed. No pertinent past medical history.   Past Surgical History:  Procedure Laterality Date   BREAST SURGERY      OB History     Gravida  12   Para  4   Term  4   Preterm      AB  8   Living  4      SAB      IAB  8   Ectopic      Multiple      Live Births              Social History   Socioeconomic History   Marital status: Single    Spouse name: Not on file   Number of children: Not on file   Years of education: Not on file   Highest education level: Not on file  Occupational History   Not on file  Tobacco Use   Smoking status: Some Days   Smokeless tobacco: Never  Vaping Use   Vaping status: Never Used  Substance and Sexual Activity   Alcohol use: Yes   Drug use: No   Sexual activity: Yes    Birth control/protection: None    Comment: uses plan B  Other Topics Concern   Not on file  Social History Narrative   Not on file   Social Drivers of Health   Tobacco Use: High Risk (02/25/2024)   Patient History    Smoking Tobacco Use: Some Days    Smokeless Tobacco Use: Never    Passive Exposure: Not on file  Financial Resource Strain: Medium Risk (02/25/2024)   Overall Financial Resource Strain (CARDIA)    Difficulty of Paying Living Expenses: Somewhat hard  Food Insecurity: No Food Insecurity (02/25/2024)   Epic    Worried About Programme Researcher, Broadcasting/film/video in the Last Year: Never true    Ran Out of  Food in the Last Year: Never true  Transportation Needs: No Transportation Needs (02/25/2024)   Epic    Lack of Transportation (Medical): No    Lack of Transportation (Non-Medical): No  Physical Activity: Inactive (02/25/2024)   Exercise Vital Sign    Days of Exercise per Week: 0 days    Minutes of Exercise per Session: 0 min  Stress: No Stress Concern Present (02/25/2024)   [employer] of Occupational Health - Occupational Stress Questionnaire    Feeling of Stress: Not at all  Social Connections: Socially Isolated (02/25/2024)   Social Connection and Isolation Panel    Frequency of Communication with Friends and Family: More than three times a week    Frequency of Social Gatherings with Friends and Family: Never    Attends Religious Services: Never    Database Administrator or Organizations: No    Attends Banker Meetings: Never    Marital Status: Never married  Depression (PHQ2-9): High Risk (02/25/2024)   Depression (PHQ2-9)    PHQ-2 Score: 22  Alcohol Screen: Low  Risk (02/25/2024)   Alcohol Screen    Last Alcohol Screening Score (AUDIT): 4  Housing: Low Risk (02/25/2024)   Epic    Unable to Pay for Housing in the Last Year: No    Number of Times Moved in the Last Year: 0    Homeless in the Last Year: No  Utilities: At Risk (02/25/2024)   Epic    Threatened with loss of utilities: Yes  Health Literacy: Adequate Health Literacy (02/25/2024)   B1300 Health Literacy    Frequency of need for help with medical instructions: Never    History reviewed. No pertinent family history.  Current Medications[1]  Review of Systems  Review of Systems  Constitutional: Negative for fever, chills, weight loss, malaise/fatigue and diaphoresis.  HENT: Negative for hearing loss, ear pain, nosebleeds, congestion, sore throat, neck pain, tinnitus and ear discharge.   Eyes: Negative for blurred vision, double vision, photophobia, pain, discharge and redness.  Respiratory:  Negative for cough, hemoptysis, sputum production, shortness of breath, wheezing and stridor.   Cardiovascular: Negative for chest pain, palpitations, orthopnea, claudication, leg swelling and PND.  Gastrointestinal: negative for abdominal pain. Negative for heartburn, nausea, vomiting, diarrhea, constipation, blood in stool and melena.  Genitourinary: Negative for dysuria, urgency, frequency, hematuria and flank pain.  Musculoskeletal: Negative for myalgias, back pain, joint pain and falls.  Skin: Negative for itching and rash.  Neurological: Negative for dizziness, tingling, tremors, sensory change, speech change, focal weakness, seizures, loss of consciousness, weakness and headaches.  Endo/Heme/Allergies: Negative for environmental allergies and polydipsia. Does not bruise/bleed easily.  Psychiatric/Behavioral: Negative for depression, suicidal ideas, hallucinations, memory loss and substance abuse. The patient is not nervous/anxious and does not have insomnia.        Objective:  Blood pressure (!) 150/90, pulse 73, height 5' 4 (1.626 m), weight 194 lb 3.2 oz (88.1 kg), last menstrual period 01/16/2024, not currently breastfeeding.   Physical Exam  Vitals reviewed. Constitutional: She is oriented to person, place, and time. She appears well-developed and well-nourished.  HENT:  Head: Normocephalic and atraumatic.        Right Ear: External ear normal.  Left Ear: External ear normal.  Nose: Nose normal.  Mouth/Throat: Oropharynx is clear and moist.  Eyes: Conjunctivae and EOM are normal. Pupils are equal, round, and reactive to light. Right eye exhibits no discharge. Left eye exhibits no discharge. No scleral icterus.  Neck: Normal range of motion. Neck supple. No tracheal deviation present. No thyromegaly present.  Cardiovascular: Normal rate, regular rhythm, normal heart sounds and intact distal pulses.  Exam reveals no gallop and no friction rub.   No murmur heard. Respiratory:  Effort normal and breath sounds normal. No respiratory distress. She has no wheezes. She has no rales. She exhibits no tenderness.  GI: Soft. Bowel sounds are normal. She exhibits no distension and no mass. There is no tenderness. There is no rebound and no guarding.  Genitourinary:  Breasts no masses skin changes or nipple changes bilaterally      Vulva is normal without lesions Vagina is pink moist without discharge Cervix normal in appearance and pap is done Uterus is normal size shape and contour Adnexa is negative with normal sized ovaries   Musculoskeletal: Normal range of motion. She exhibits no edema and no tenderness.  Neurological: She is alert and oriented to person, place, and time. She has normal reflexes. She displays normal reflexes. No cranial nerve deficit. She exhibits normal muscle tone. Coordination normal.  Skin: Skin is  warm and dry. No rash noted. No erythema. No pallor.  Psychiatric: She has a normal mood and affect. Her behavior is normal. Judgment and thought content normal.       Medications Ordered at today's visit: No orders of the defined types were placed in this encounter.   Other orders placed at today's visit: Orders Placed This Encounter  Procedures   US  PELVIC COMPLETE WITH TRANSVAGINAL   POCT urine pregnancy     ASSESSMENT + PLAN:    ICD-10-CM   1. Well woman exam with routine gynecological exam  Z01.419     2. Encounter for gynecological examination with Papanicolaou smear of cervix  Z01.419 Cytology - PAP( [facility])    CANCELED: Cytology - PAP( [facility])    3. Late period  N92.6 POCT urine pregnancy    4. Vaginal discharge  N89.8 Cervicovaginal ancillary only( [place])    5. Dysmenorrhea  N94.6 US  PELVIC COMPLETE WITH TRANSVAGINAL    6. Menorrhagia with regular cycle  N92.0 US  PELVIC COMPLETE WITH TRANSVAGINAL          Return in about 2 weeks (around 03/10/2024) for GYN sono, Follow up, with Dr Jayne.     [1] No  current outpatient medications on file.

## 2024-02-26 ENCOUNTER — Ambulatory Visit: Payer: Self-pay | Admitting: Obstetrics & Gynecology

## 2024-02-26 LAB — CERVICOVAGINAL ANCILLARY ONLY
Bacterial Vaginitis (gardnerella): POSITIVE — AB
Candida Glabrata: NEGATIVE
Candida Vaginitis: NEGATIVE
Chlamydia: NEGATIVE
Comment: NEGATIVE
Comment: NEGATIVE
Comment: NEGATIVE
Comment: NEGATIVE
Comment: NEGATIVE
Comment: NORMAL
Neisseria Gonorrhea: NEGATIVE
Trichomonas: NEGATIVE

## 2024-02-26 MED ORDER — METRONIDAZOLE 0.75 % VA GEL: 1.0000 | Freq: Every day | VAGINAL | 5 refills | Status: AC

## 2024-02-27 LAB — CYTOLOGY - PAP
Adequacy: ABSENT
Comment: NEGATIVE
Diagnosis: UNDETERMINED — AB
High risk HPV: NEGATIVE

## 2024-03-15 ENCOUNTER — Ambulatory Visit: Admitting: Nurse Practitioner

## 2024-03-16 ENCOUNTER — Ambulatory Visit: Admitting: Obstetrics & Gynecology

## 2024-03-16 ENCOUNTER — Encounter (HOSPITAL_BASED_OUTPATIENT_CLINIC_OR_DEPARTMENT_OTHER): Admitting: Radiology

## 2024-03-16 ENCOUNTER — Ambulatory Visit

## 2024-03-16 VITALS — BP 132/81 | HR 76 | Ht 64.0 in | Wt 200.0 lb

## 2024-03-16 DIAGNOSIS — N920 Excessive and frequent menstruation with regular cycle: Secondary | ICD-10-CM

## 2024-03-16 DIAGNOSIS — N946 Dysmenorrhea, unspecified: Secondary | ICD-10-CM

## 2024-03-16 DIAGNOSIS — Z1231 Encounter for screening mammogram for malignant neoplasm of breast: Secondary | ICD-10-CM

## 2024-03-16 DIAGNOSIS — N83201 Unspecified ovarian cyst, right side: Secondary | ICD-10-CM

## 2024-03-16 NOTE — Progress Notes (Addendum)
 PELVIC US  TA/TV: heterogeneous anteverted uterus with multiple fibroids,(#1) fundal intramural fibroid 2 x 1.7 x 1.5 cm,(#2) posterior subserosal fibroid 9 x 8 x 5 mm,EEC 6.2 cm,homogeneous endometrium with ill defined boarders,right complex unilocular ovarian cyst  3.2 x 2.8 x 2.2 cm with one papillary vascular projection 8 x 6 x 6 cm,small amount of simple right  adnexal fluid,normal left ovary,ovaries appear mobile

## 2024-03-17 LAB — OVARIAN MALIGNANCY RISK-ROMA
Cancer Antigen (CA) 125: 17 U/mL (ref 0.0–38.1)
HE4: 46.6 pmol/L (ref 0.0–61.2)
Postmenopausal ROMA: 1.17
Premenopausal ROMA: 0.64

## 2024-03-17 LAB — PREMENOPAUSAL INTERP: LOW

## 2024-03-17 LAB — POSTMENOPAUSAL INTERP: LOW

## 2024-04-07 ENCOUNTER — Ambulatory Visit: Admitting: Nurse Practitioner
